# Patient Record
Sex: Female | Born: 1975 | Race: White | Hispanic: No | Marital: Married | Smoking: Never smoker
Health system: Southern US, Community
[De-identification: ages and names within clinical notes are randomized; demographics above are authoritative.]

## PROBLEM LIST (undated history)

## (undated) DIAGNOSIS — Z789 Other specified health status: Secondary | ICD-10-CM

## (undated) HISTORY — PX: TONSILLECTOMY: SUR1361

## (undated) HISTORY — PX: WISDOM TOOTH EXTRACTION: SHX21

## (undated) HISTORY — PX: BREAST BIOPSY: SHX20

---

## 2003-05-03 ENCOUNTER — Other Ambulatory Visit: Admission: RE | Admit: 2003-05-03 | Discharge: 2003-05-03 | Payer: Self-pay | Admitting: Obstetrics and Gynecology

## 2003-10-22 ENCOUNTER — Emergency Department (HOSPITAL_COMMUNITY): Admission: EM | Admit: 2003-10-22 | Discharge: 2003-10-22 | Payer: Self-pay | Admitting: Emergency Medicine

## 2004-05-11 ENCOUNTER — Other Ambulatory Visit: Admission: RE | Admit: 2004-05-11 | Discharge: 2004-05-11 | Payer: Self-pay | Admitting: Obstetrics and Gynecology

## 2005-04-09 ENCOUNTER — Ambulatory Visit: Payer: Self-pay

## 2005-04-09 ENCOUNTER — Ambulatory Visit: Payer: Self-pay | Admitting: Cardiology

## 2005-04-13 ENCOUNTER — Ambulatory Visit: Payer: Self-pay | Admitting: Internal Medicine

## 2005-04-19 ENCOUNTER — Ambulatory Visit (HOSPITAL_COMMUNITY): Admission: RE | Admit: 2005-04-19 | Discharge: 2005-04-19 | Payer: Self-pay | Admitting: Internal Medicine

## 2005-05-18 ENCOUNTER — Other Ambulatory Visit: Admission: RE | Admit: 2005-05-18 | Discharge: 2005-05-18 | Payer: Self-pay | Admitting: Obstetrics and Gynecology

## 2007-03-27 ENCOUNTER — Encounter: Admission: RE | Admit: 2007-03-27 | Discharge: 2007-03-27 | Payer: Self-pay | Admitting: Sports Medicine

## 2007-04-03 ENCOUNTER — Encounter: Admission: RE | Admit: 2007-04-03 | Discharge: 2007-04-03 | Payer: Self-pay | Admitting: Sports Medicine

## 2007-04-17 ENCOUNTER — Encounter: Admission: RE | Admit: 2007-04-17 | Discharge: 2007-04-17 | Payer: Self-pay | Admitting: Sports Medicine

## 2007-09-29 ENCOUNTER — Encounter: Admission: RE | Admit: 2007-09-29 | Discharge: 2007-09-29 | Payer: Self-pay | Admitting: Sports Medicine

## 2007-12-14 ENCOUNTER — Encounter: Admission: RE | Admit: 2007-12-14 | Discharge: 2007-12-14 | Payer: Self-pay | Admitting: Obstetrics and Gynecology

## 2009-04-21 LAB — HM PAP SMEAR

## 2009-08-13 ENCOUNTER — Ambulatory Visit: Payer: Self-pay | Admitting: Family Medicine

## 2009-08-14 ENCOUNTER — Telehealth: Payer: Self-pay | Admitting: *Deleted

## 2009-08-14 ENCOUNTER — Encounter: Admission: RE | Admit: 2009-08-14 | Discharge: 2009-08-14 | Payer: Self-pay | Admitting: Family Medicine

## 2009-08-14 ENCOUNTER — Ambulatory Visit: Payer: Self-pay | Admitting: Family Medicine

## 2009-08-15 ENCOUNTER — Telehealth: Payer: Self-pay | Admitting: Family Medicine

## 2009-08-15 ENCOUNTER — Encounter: Payer: Self-pay | Admitting: Family Medicine

## 2010-03-16 ENCOUNTER — Telehealth: Payer: Self-pay | Admitting: Family Medicine

## 2010-03-16 ENCOUNTER — Ambulatory Visit: Payer: Self-pay | Admitting: Family Medicine

## 2010-03-16 DIAGNOSIS — K645 Perianal venous thrombosis: Secondary | ICD-10-CM | POA: Insufficient documentation

## 2010-07-12 ENCOUNTER — Encounter: Payer: Self-pay | Admitting: Sports Medicine

## 2010-07-21 NOTE — Progress Notes (Signed)
Summary: Med  Phone Note From Pharmacy   Caller: [facility] Summary of Call: Do not have the Lidocaine % that you sent over. They do have the Lidocaine 3/0.5% available. Is this ok to use and if so will directions need to be changed Initial call taken by: Kathlene November,  March 16, 2010 1:35 PM  Follow-up for Phone Call        New one sent.  Follow-up by: Nani Gasser MD,  March 16, 2010 2:43 PM    New/Updated Medications: ANAMANTLE HC 3-0.5 % KIT (LIDOCAINE-HYDROCORTISONE ACE) Apply to rectal area Prescriptions: ANAMANTLE HC 3-0.5 % KIT (LIDOCAINE-HYDROCORTISONE ACE) Apply to rectal area  #1 kit x 0   Entered and Authorized by:   Nani Gasser MD   Signed by:   Nani Gasser MD on 03/16/2010   Method used:   Electronically to        [facility]* (retail)       [address] [place], [place]  [address]       Ph: [phone]       Fax: [phone]   RxID:   [id number]

## 2010-07-21 NOTE — Assessment & Plan Note (Signed)
Summary: rib pain   Vital Signs:  Patient profile:   35 year old female Height:      72 inches Weight:      148 pounds BMI:     20.14 O2 Sat:      99 % on Room air Temp:     98.3 degrees F oral Pulse rate:   65 / minute BP sitting:   116 / 71  (left arm) Cuff size:   regular  Vitals Entered By: Payton Spark CMA (August 14, 2009 4:21 PM)  O2 Flow:  Room air CC: L sided rib pain from coughing.    Primary Care Provider:  Nani Gasser, MD  CC:  L sided rib pain from coughing. Marland Kitchen  History of Present Illness: 35 yo WF presents for cough and L sided rib pain that started last night with deep cough.  She has severe pain with coughing and deep breathing.  She has pain with touching and moving.  No edema, brusing, redness or chest wall trauma.  Already on abx, inhaler and cough meds.  No fevers.  Not acutely SOB.  Current Medications (verified): 1)  Loestrin 1.5/30 (21) 1.5-30 Mg-Mcg Tabs (Norethindrone Acet-Ethinyl Est) .... Take One Tablet By Mouth Once A Day 2)  Wellbutrin Xl 300 Mg Xr24h-Tab (Bupropion Hcl) .... Take One Tablet By Mouth Once A Day 3)  Zoloft 100 Mg Tabs (Sertraline Hcl) .... Take One Tablet By Mouth Once A Day 4)  Multivitamins  Caps (Multiple Vitamin) .... Take One Capsule By Mouth Once A Day 5)  Vitamin C Cr 500 Mg Cr-Tabs (Ascorbic Acid) .... Take One Tablet By Mouth Once A Day 6)  Vitamin E 1000 Unit Caps (Vitamin E) .... Take One Capsule By Mouth Once Ad Ay 7)  Hydrocodone-Homatropine 5-1.5 Mg/86ml Syrp (Hydrocodone-Homatropine) .... 5ml By Mouth At Bedtime 8)  Doxycycline Hyclate 100 Mg Caps (Doxycycline Hyclate) .... Take 1 Tablet By Mouth Two Times A Day For 10 Days 9)  Proair Hfa 108 (90 Base) Mcg/act Aers (Albuterol Sulfate) .... 2- 4 Puffs Inhaled Every 4-6 Hours As Needed  Allergies (verified): No Known Drug Allergies  Past History:  Social History: Reviewed history from 08/13/2009 and no changes required. 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  Married to [employer]. Has 3 step-children.   Never Smoked Alcohol use-yes Drug use-no Regular exercise-yes 3 caffeinatedbeverages daily.   Review of Systems      See HPI  Physical Exam  General:  alert, well-developed, well-nourished, and well-hydrated.   Head:  normocephalic and atraumatic.   Nose:  no nasal discharge.   Mouth:  pharynx pink and moist.  hoarse Chest Wall:  L anterolateral lower ribs TTP with full chest wall/ UE ROM Lungs:  Normal respiratory effort, chest expands symmetrically. Lungs are clear to auscultation, no crackles or wheezes.  slight splinting with deep inspiration.  dry cough Heart:  Normal rate and regular rhythm. S1 and S2 normal without gallop, murmur, click, rub or other extra sounds. Abdomen:  soft and non-tender.   Skin:  no redness or bruising Cervical Nodes:  No lymphadenopathy noted Psych:  not anxious appearing.     Impression & Recommendations:  Problem # 1:  RIB PAIN, LEFT SIDED (ICD-786.50) Unlikely rib fracture but will Xray to be sure.  Likley inflammation around the rib muscles from deep coughing.  Can continue Motrin with food, heat and an ACE wrap for comfort along with deep breathing exercises to prevent atx. Continue current tx plan for  bronchitis. Orders: T-DG Chest 2 View ([id number])  Complete Medication List: 1)  Loestrin 1.5/30 (21) 1.5-30 Mg-mcg Tabs (Norethindrone acet-ethinyl est) .... Take one tablet by mouth once a day 2)  Wellbutrin Xl 300 Mg Xr24h-tab (Bupropion hcl) .... Take one tablet by mouth once a day 3)  Zoloft 100 Mg Tabs (Sertraline hcl) .... Take one tablet by mouth once a day 4)  Multivitamins Caps (Multiple vitamin) .... Take one capsule by mouth once a day 5)  Vitamin C Cr 500 Mg Cr-tabs (Ascorbic acid) .... Take one tablet by mouth once a day 6)  Vitamin E 1000 Unit Caps (Vitamin e) .... Take one capsule by mouth once ad ay 7)  Hydrocodone-homatropine 5-1.5 Mg/3ml Syrp (Hydrocodone-homatropine) .... 5ml by  mouth at bedtime 8)  Doxycycline Hyclate 100 Mg Caps (Doxycycline hyclate) .... Take 1 tablet by mouth two times a day for 10 days 9)  Proair Hfa 108 (90 Base) Mcg/act Aers (Albuterol sulfate) .... 2- 4 puffs inhaled every 4-6 hours as needed  Patient Instructions: 1)  CXR today. 2)  Will call you w/ results tomorrow. 3)  Use Ibuprofen 800 mg 3 x a day with food for pain. 4)  Use ACE wrap for comfort. 5)  Remember to do deep breathing exercises  - 10 deep breaths every hr during the day.

## 2010-07-21 NOTE — Progress Notes (Signed)
Summary: ? cracked rib  Phone Note Call from Patient Call back at [phone]   Caller: Patient Call For: Nani Gasser MD Summary of Call: Pt husband calls and states that ? cracked rib from coughing so much- says wife is like doubled over from coughing and in pain- please advise Initial call taken by: Kathlene November,  August 15, 2009 9:07 AM  Follow-up for Phone Call        Offered pain meds but pt declined at this point.  I personally viewed her CXR and am reassured that she has no rib fractures.  I will give her a work note thru [facility].   Follow-up by: Seymour Bars DO,  August 15, 2009 9:15 AM

## 2010-07-21 NOTE — Progress Notes (Signed)
Summary: Rx called in  Phone Note Call from Patient   Caller: Spouse Summary of Call: Dr.Metheney    Call Back  [phone]     Pharmacy--[facility] in [place]  Patient husband called and said that only one of the three meds was called in to the pharmacy yesterday---the one that was called in was the cough med.Marland KitchenMarland KitchenNeed the other two called in. Initial call taken by: Vanessa Swaziland,  August 14, 2009 10:00 AM  Follow-up for Phone Call        Rx Called In.  patient's husband is aware Follow-up by: Kern Reap CMA Duncan Dull),  August 14, 2009 10:13 AM

## 2010-07-21 NOTE — Assessment & Plan Note (Signed)
Summary: Hemorrhoids   Vital Signs:  Patient profile:   35 year old female Height:      72 inches Weight:      159 pounds Pulse rate:   61 / minute BP sitting:   125 / 78  (right arm) Cuff size:   regular  Vitals Entered By: Avon Gully CMA, Duncan Dull) (March 16, 2010 9:53 AM) CC: hemmrrhoids,first noticed friday   Primary Care Provider:  Nani Gasser, MD  CC:  hemmrrhoids and first noticed friday.  History of Present Illness: Hx of hemorrhoids. Thinks maybe has a blood clot in one of the hemorrhoids. Last week started running again and lifting heavy furniture. No constipation or change in bowels. Used preparation H over the weekend. No blood thinners. Uses metamucil daily. NO abdomianl pain. No bleeding.   Current Medications (verified): 1)  Loestrin 1.5/30 (21) 1.5-30 Mg-Mcg Tabs (Norethindrone Acet-Ethinyl Est) .... Take One Tablet By Mouth Once A Day 2)  Wellbutrin Xl 300 Mg Xr24h-Tab (Bupropion Hcl) .... Take One Tablet By Mouth Once A Day 3)  Multivitamins  Caps (Multiple Vitamin) .... Take One Capsule By Mouth Once A Day 4)  Vitamin C Cr 500 Mg Cr-Tabs (Ascorbic Acid) .... Take One Tablet By Mouth Once A Day 5)  Vitamin E 1000 Unit Caps (Vitamin E) .... Take One Capsule By Mouth Once Ad Ay 6)  Proair Hfa 108 (90 Base) Mcg/act Aers (Albuterol Sulfate) .... 2- 4 Puffs Inhaled Every 4-6 Hours As Needed  Allergies (verified): No Known Drug Allergies  Comments:  Nurse/Medical Assistant: The patient's medications and allergies were reviewed with the patient and were updated in the Medication and Allergy Lists. Avon Gully CMA, Duncan Dull) (March 16, 2010 9:54 AM)  Physical Exam  General:  Well-developed,well-nourished,in no acute distress; alert,appropriate and cooperative throughout examination Rectal:  Thrombosed hemorrhoid at the 3 oclock position. Really not very tender.  2 other hemorrhoids that are not imflammed   Impression &  Recommendations:  Problem # 1:  HEMORRHOID, THROMBOSED (ICD-455.7) Keep stools soft. Avoid lifting weigths.  Since really she is not in alot of pain will tx conservatively with topical cream and sitz baths.  Offered to lance today or later if not better. She decided to try the cream first and if not better or worse to f/u for lancing.   Complete Medication List: 1)  Loestrin 1.5/30 (21) 1.5-30 Mg-mcg Tabs (Norethindrone acet-ethinyl est) .... Take one tablet by mouth once a day 2)  Wellbutrin Xl 300 Mg Xr24h-tab (Bupropion hcl) .... Take one tablet by mouth once a day 3)  Multivitamins Caps (Multiple vitamin) .... Take one capsule by mouth once a day 4)  Vitamin C Cr 500 Mg Cr-tabs (Ascorbic acid) .... Take one tablet by mouth once a day 5)  Vitamin E 1000 Unit Caps (Vitamin e) .... Take one capsule by mouth once ad ay 6)  Proair Hfa 108 (90 Base) Mcg/act Aers (Albuterol sulfate) .... 2- 4 puffs inhaled every 4-6 hours as needed 7)  Lidocaine-hydrocortisone Ace 3-1 % Kit (Lidocaine-hydrocortisone ace) .... Apply to rectal area.  Other Orders: Flu Vaccine 46yrs + ([address]) Admin 1st Vaccine ([address])  Patient Instructions: 1)  Sitz baths 3 x day.  2)  Call if not better in 1-2 weeks or if getting worse.   Prescriptions: LIDOCAINE-HYDROCORTISONE ACE 3-1 % KIT (LIDOCAINE-HYDROCORTISONE ACE) Apply to rectal area.  #1 kit. x 1   Entered and Authorized by:   Nani Gasser MD   Signed by:   Santina Evans  Metheney MD on 03/16/2010   Method used:   Electronically to        [facility]* (retail)       [address]       [place], [place]  [address]       Ph: [phone]       Fax: [phone]   RxID:   [id number]    Immunization History:  Tetanus/Td Immunization History:    Tetanus/Td:  historical (06/21/2004)   Immunization History:  Tetanus/Td Immunization History:    Tetanus/Td:  historical (06/21/2004)  Immunizations Administered:  Influenza Vaccine  # 1:    Vaccine Type: Fluvax 3+    Site: right deltoid    Mfr: GlaxoSmithKline    Dose: 0.5 ml    Route: IM    Given by: Sue Lush McCrimmon CMA, (AAMA)    Exp. Date: 12/19/2010    Lot #: [id number]    VIS given: 01/13/10 version given March 16, 2010.  Flu Vaccine Consent Questions:    Do you have a history of severe allergic reactions to this vaccine? no    Any prior history of allergic reactions to egg and/or gelatin? no    Do you have a sensitivity to the preservative Thimersol? no    Do you have a past history of Guillan-Barre Syndrome? no    Do you currently have an acute febrile illness? no    Have you ever had a severe reaction to latex? no    Vaccine information given and explained to patient? no    Are you currently pregnant? no

## 2010-07-21 NOTE — Assessment & Plan Note (Signed)
Summary: NOV: Cough   Vital Signs:  Patient profile:   35 year old female Height:      72 inches Weight:      147 pounds BMI:     20.01 O2 Sat:      97 % on Room air Temp:     98.4 degrees F oral Pulse rate:   65 / minute BP sitting:   113 / 62  (left arm) Cuff size:   regular  Vitals Entered By: Kathlene November (August 13, 2009 1:10 PM)  O2 Flow:  Room air CC: NP- bad cough and hoarseness for over 1 week now- not sleeping due to cough Is Patient Diabetic? No   Primary Care Provider:  Nani Gasser, MD  CC:  NP- bad cough and hoarseness for over 1 week now- not sleeping due to cough.  History of Present Illness: NP- bad cough and hoarseness for over 1 week now- not sleeping due to cough.  Never hadthis before. Started with chest burning with cough. Now has CP.  Cough is dry.  No sinus sxs. No ST.  No pulmonary problems.No fever.  Tried mucinex DM, sudafed, cough meds - nothing has touched it.No SOB. Non-smoker. Husband with similar sxs but he is better.   Habits & Providers  Alcohol-Tobacco-Diet     Alcohol drinks/day: <1     Tobacco Status: never  Exercise-Depression-Behavior     Does Patient Exercise: yes     Type of exercise: cardio, resistence training     Exercise (avg: min/session): 30-60     Times/week: 4     STD Risk: never     Drug Use: no     Seat Belt Use: always  Current Medications (verified): 1)  Loestrin 1.5/30 (21) 1.5-30 Mg-Mcg Tabs (Norethindrone Acet-Ethinyl Est) .... Take One Tablet By Mouth Once A Day 2)  Wellbutrin Xl 300 Mg Xr24h-Tab (Bupropion Hcl) .... Take One Tablet By Mouth Once A Day 3)  Zoloft 100 Mg Tabs (Sertraline Hcl) .... Take One Tablet By Mouth Once A Day 4)  Multivitamins  Caps (Multiple Vitamin) .... Take One Capsule By Mouth Once A Day 5)  Vitamin C Cr 500 Mg Cr-Tabs (Ascorbic Acid) .... Take One Tablet By Mouth Once A Day 6)  Vitamin E 1000 Unit Caps (Vitamin E) .... Take One Capsule By Mouth Once Ad Ay  Allergies  (verified): No Known Drug Allergies  Comments:  Nurse/Medical Assistant: The patient's medications and allergies were reviewed with the patient and were updated in the Medication and Allergy Lists. 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 Married to [employer]. Has 3 step-children.   Never Smoked Alcohol use-yes Drug use-no Regular exercise-yes 3 caffeinatedbeverages daily. Smoking Status:  never Does Patient Exercise:  yes STD Risk:  never Drug Use:  no Seat Belt Use:  always  Review of Systems       No fever/sweats/weakness, unexplained weight loss/gain.  No vison changes.  No difficulty hearing/ringing in ears, hay fever/allergies.  No chest pain/discomfort, palpitations.  No Br lump/nipple discharge.  + cough/wheeze.  No blood in BM, nausea/vomiting/diarrhea.  No nighttime urination, leaking urine, unusual vaginal bleeding, discharge (penis or vagina).  No muscle/joint pain. No rash, change in mole.  No HA, memory loss.  No anxiety, sleep d/o, depression.  No easy bruising/bleeding, unexplained  lump   Physical Exam  General:  Well-developed,well-nourished,in no acute distress; alert,appropriate and cooperative throughout examination Head:  Normocephalic and atraumatic without obvious abnormalities. No apparent alopecia or balding. Eyes:  No corneal or conjunctival inflammation noted. EOMI. Perrla.  Ears:  External ear exam shows no significant lesions or deformities.  Otoscopic examination reveals clear canals, tympanic membranes are intact bilaterally without bulging, retraction, inflammation or discharge. Hearing is grossly normal bilaterally. Nose:  External nasal examination shows no deformity or inflammation. Nasal mucosa are pink and moist without lesions or exudates. Mouth:  Oral mucosa  and oropharynx without lesions or exudates.  Teeth in good repair. Neck:  No deformities, masses, or tenderness noted. Lungs:  Normal respiratory effort, chest expands symmetrically. Lungs are clear to auscultation, no crackles or wheezes. Heart:  Normal rate and regular rhythm. S1 and S2 normal without gallop, murmur, click, rub or other extra sounds. Skin:  no rashes.   Cervical Nodes:  no anterior cervical adenopathy.   Psych:  Cognition and judgment appear intact. Alert and cooperative with normal attention span and concentration. No apparent delusions, illusions, hallucinations   Impression & Recommendations:  Problem # 1:  BRONCHITIS, ACUTE (ICD-466.0)  Her updated medication list for this problem includes:    Hydrocodone-homatropine 5-1.5 Mg/63ml Syrp (Hydrocodone-homatropine) .Marland KitchenMarland KitchenMarland KitchenMarland Kitchen 5ml by mouth at bedtime    Doxycycline Hyclate 100 Mg Caps (Doxycycline hyclate) .Marland Kitchen... Take 1 tablet by mouth two times a day for 10 days    Proair Hfa 108 (90 Base) Mcg/act Aers (Albuterol sulfate) .Marland Kitchen... 2- 4 puffs inhaled every 4-6 hours as needed  Take antibiotics and other medications as directed. Encouraged to push clear liquids, get enough rest, and take acetaminophen as needed. To be seen in 5-7 days if no improvement, sooner if worse.  Complete Medication List: 1)  Loestrin 1.5/30 (21) 1.5-30 Mg-mcg Tabs (Norethindrone acet-ethinyl est) .... Take one tablet by mouth once a day 2)  Wellbutrin Xl 300 Mg Xr24h-tab (Bupropion hcl) .... Take one tablet by mouth once a day 3)  Zoloft 100 Mg Tabs (Sertraline hcl) .... Take one tablet by mouth once a day 4)  Multivitamins Caps (Multiple vitamin) .... Take one capsule by mouth once a day 5)  Vitamin C Cr 500 Mg Cr-tabs (Ascorbic acid) .... Take one tablet by mouth once a day 6)  Vitamin E 1000 Unit Caps (Vitamin e) .... Take one capsule by mouth once ad ay 7)  Hydrocodone-homatropine 5-1.5 Mg/37ml Syrp (Hydrocodone-homatropine) .... 5ml by mouth at  bedtime 8)  Doxycycline Hyclate 100 Mg Caps (Doxycycline hyclate) .... Take 1 tablet by mouth two times a day for 10 days 9)  Proair Hfa 108 (90 Base) Mcg/act Aers (Albuterol sulfate) .... 2- 4 puffs inhaled every 4-6 hours as needed Prescriptions: PROAIR HFA 108 (90 BASE) MCG/ACT AERS (ALBUTEROL SULFATE) 2- 4 puffs inhaled every 4-6 hours as needed  #1 x 0   Entered and Authorized by:   Nani Gasser MD   Signed by:   Nani Gasser MD on 08/13/2009   Method used:   Printed then faxed to ...       [facility]* (retail)       [address] [place], [place]  [address]       Ph: [phone]       Fax: [phone]   RxID:   [id number] DOXYCYCLINE HYCLATE 100 MG CAPS (DOXYCYCLINE HYCLATE) Take 1 tablet by mouth two times a day for 10  days  #20 x 0   Entered and Authorized by:   Nani Gasser MD   Signed by:   Nani Gasser MD on 08/13/2009   Method used:   Printed then faxed to ...       [facility]* (retail)       [address] [place], [place]  [address]       Ph: [phone]       Fax: [phone]   RxID:   [id number] HYDROCODONE-HOMATROPINE 5-1.5 MG/5ML SYRP (HYDROCODONE-HOMATROPINE) 5ml by mouth at bedtime  #143ml x 0   Entered and Authorized by:   Nani Gasser MD   Signed by:   Nani Gasser MD on 08/13/2009   Method used:   Printed then faxed to ...       [facility]* (retail)       [address] [place], [place]  [address]       Ph: [phone]       Fax: [phone]   RxID:   [id number]   PAP Result Date:  04/21/2009 PAP Result:  normal

## 2010-07-21 NOTE — Letter (Signed)
Summary: Out of Work  [facility]  [address]   [place], [place] [address]   Phone: [phone]  Fax: [phone]    August 15, 2009   Employee:  LETISIA SCHWALB    To Whom It May Concern:   For Medical reasons, please excuse the above named employee from work for the following dates:  Start:   Feb 25th - March 2nd  End:   March 3rd  If you need additional information, please feel free to contact our office.         Sincerely,    Seymour Bars DO

## 2010-08-04 ENCOUNTER — Encounter: Payer: Self-pay | Admitting: Family Medicine

## 2010-08-04 ENCOUNTER — Ambulatory Visit (INDEPENDENT_AMBULATORY_CARE_PROVIDER_SITE_OTHER): Payer: 59 | Admitting: Family Medicine

## 2010-08-04 DIAGNOSIS — F4321 Adjustment disorder with depressed mood: Secondary | ICD-10-CM

## 2010-08-04 DIAGNOSIS — F324 Major depressive disorder, single episode, in partial remission: Secondary | ICD-10-CM | POA: Insufficient documentation

## 2010-08-04 DIAGNOSIS — J069 Acute upper respiratory infection, unspecified: Secondary | ICD-10-CM | POA: Insufficient documentation

## 2010-08-12 NOTE — Assessment & Plan Note (Signed)
Summary: Melinda Reynolds, INsomnia, grieving   Vital Signs:  Patient profile:   35 year old female Height:      72 inches Weight:      166 pounds Pulse rate:   72 / minute BP sitting:   119 / 67  (right arm) Cuff size:   regular  Vitals Entered By: Avon Gully CMA, Duncan Dull) (August 04, 2010 1:54 PM) CC: wants something to help speed up cold sx, wants something to help her sleep   Primary Care Provider:  Nani Gasser, MD  CC:  wants something to help speed up cold sx and wants something to help her sleep.  History of Present Illness: wants something to help speed up cold sx, wants something to help her sleep.  She has been sick for about 3 days with nasal congestion, yellow. No fever. Mild cough but from driange. NO other chest sxs.. Left ear pressure.Taking mucinex D and Affrin, Benadryl at Advil.   Her father has been given 2-4 weeks to live and she is not allowed to visit him while he is sick. She is flying out in about 2 days and she will stay there until he passes away. She is currently on Wellbutrin and is no sleeping and is crying all tht time to the point of almost hyperventilating.   Current Medications (verified): 1)  Loestrin 1.5/30 (21) 1.5-30 Mg-Mcg Tabs (Norethindrone Acet-Ethinyl Est) .... Take One Tablet By Mouth Once A Day 2)  Wellbutrin Xl 300 Mg Xr24h-Tab (Bupropion Hcl) .... Take One Tablet By Mouth Once A Day 3)  Multivitamins  Caps (Multiple Vitamin) .... Take One Capsule By Mouth Once A Day 4)  Vitamin C Cr 500 Mg Cr-Tabs (Ascorbic Acid) .... Take One Tablet By Mouth Once A Day 5)  Vitamin E 1000 Unit Caps (Vitamin E) .... Take One Capsule By Mouth Once Ad Ay 6)  Proair Hfa 108 (90 Base) Mcg/act Aers (Albuterol Sulfate) .... 2- 4 Puffs Inhaled Every 4-6 Hours As Needed 7)  Anamantle Hc 3-0.5 % Kit (Lidocaine-Hydrocortisone Ace) .... Apply To Rectal Area  Allergies (verified): No Known Drug Allergies  Comments:  Nurse/Medical Assistant: The patient's  medications and allergies were reviewed with the patient and were updated in the Medication and Allergy Lists. Avon Gully CMA, Duncan Dull) (August 04, 2010 1:55 PM)  Family History: Father with MI, Hi chol, small cell lung ca.  GF wtih DM  Social History: Used to work in Administrator, arts. Married to [employer]. Has 3 step-children.   Never Smoked Alcohol use-yes Drug use-no Regular exercise-yes 3 caffeinatedbeverages daily.   Physical Exam  General:  Well-developed,well-nourished,in no acute distress; alert,appropriate and cooperative throughout examination Head:  Normocephalic and atraumatic without obvious abnormalities. No apparent alopecia or balding. Eyes:  No corneal or conjunctival inflammation noted. EOMI. Perrla.  Ears:  External ear exam shows no significant lesions or deformities.  Otoscopic examination reveals clear canals, tympanic membranes are intact bilaterally without bulging, retraction, inflammation or discharge. Hearing is grossly normal bilaterally. Nose:  External nasal examination shows no deformity or inflammation.  Mouth:  Oral mucosa and oropharynx without lesions or exudates.  Teeth in good repair. Neck:  No deformities, masses, or tenderness noted. Lungs:  Normal respiratory effort, chest expands symmetrically. Lungs are clear to auscultation, no crackles or wheezes. Heart:  Normal rate and regular rhythm. S1 and S2 normal without gallop, murmur, click, rub or other extra sounds.   Impression & Recommendations:  Problem # 1:  Melinda Reynolds (ICD-465.9)  Instructed  on symptomatic treatment. Call if symptoms persist or worsen. I worsening or not better in one week can fill the ABX. Since will be out of state for several weeks gave her to rx to take with her if not getting better. Use appropriate hygiene and usual precaution.   Problem # 2:  GRIEF REACTION, ACUTE (ICD-309.0) Will start sertraline which she has taken in the past nad done well  with. Will taper the  wellbutrin. In one month can go back to the sertaline 100mg  tabs (which she states she still has at home).  Also given xanax to help her fall asleep and to use if she feels panicky. Also discussed the short term nature of the xanax since it can be addictive and cause dependency. Explained 45 tabs should last until I see her back in 4-6 weeks (or when she gets backl). Pt to call or get help and starts to feel overwhelmed. Her family is very supportive.  She may benefit from grief counseling when she returns.   Problem # 3:  INSOMNIA (ICD-780.52) Directly related to her grieving. See note on this.   Complete Medication List: 1)  Loestrin 1.5/30 (21) 1.5-30 Mg-mcg Tabs (Norethindrone acet-ethinyl est) .... Take one tablet by mouth once a day 2)  Wellbutrin Xl 150 Mg Xr24h-tab (Bupropion hcl) .... Take 1 tablet by mouth once a day 3)  Multivitamins Caps (Multiple vitamin) .... Take one capsule by mouth once a day 4)  Vitamin C Cr 500 Mg Cr-tabs (Ascorbic acid) .... Take one tablet by mouth once a day 5)  Vitamin E 1000 Unit Caps (Vitamin e) .... Take one capsule by mouth once ad ay 6)  Proair Hfa 108 (90 Base) Mcg/act Aers (Albuterol sulfate) .... 2- 4 puffs inhaled every 4-6 hours as needed 7)  Anamantle Hc 3-0.5 % Kit (Lidocaine-hydrocortisone ace) .... Apply to rectal area 8)  Sertraline Hcl 50 Mg Tabs (Sertraline hcl) .... 1/2 tab by mouth for the first week then increase to whole tab daily 9)  Zithromax Z-pak 250 Mg Tabs (Azithromycin) .... Take as directed. 10)  Alprazolam 0.5 Mg Tabs (Alprazolam) .... Take 1 tablet by mouth two times a day as needed for anxiety/nerves   Patient Instructions: 1)  Start the sertaline (zoloft) per the bottle instructions.  2)  Then after 2 weeks start the new bottle of WEllbutrine 150mg XL and take for 2 weeks and then take every other day for 2 weeks and then stop.  3)  Then follow up with me when you get back.   4)  Xanax is the rescue medicine. Can use at  bedtime or take maybe  half a pill during the day.  Prescriptions: WELLBUTRIN XL 150 MG XR24H-TAB (BUPROPION HCL) Take 1 tablet by mouth once a day  #30 x 0   Entered and Authorized by:   Nani Gasser MD   Signed by:   Nani Gasser MD on 08/04/2010   Method used:   Electronically to        [facility]* (retail)       [address]       [place], [place]  [address]       Ph: [phone]       Fax: [phone]   RxID:   [id number] ALPRAZOLAM 0.5 MG TABS (ALPRAZOLAM) Take 1 tablet by mouth two times a day as needed for anxiety/nerves  #45 x 0   Entered and Authorized by:   Nani Gasser MD   Signed  by:   Nani Gasser MD on 08/04/2010   Method used:   Printed then faxed to ...       [facility]* (retail)       [address]       [place], [place]  [address]       Ph: [phone]       Fax: [phone]   RxID:   [id number] ZITHROMAX Z-PAK 250 MG TABS (AZITHROMYCIN) TAke as directed.  #1 pack x 0   Entered and Authorized by:   Nani Gasser MD   Signed by:   Nani Gasser MD on 08/04/2010   Method used:   Electronically to        [facility]* (retail)       [address]       [place], [place]  [address]       Ph: [phone]       Fax: [phone]   RxID:   [id number] SERTRALINE HCL 50 MG TABS (SERTRALINE HCL) 1/2 tab by mouth for the first week then increase to whole tab daily  #30 x 0   Entered and Authorized by:   Nani Gasser MD   Signed by:   Nani Gasser MD on 08/04/2010   Method used:   Electronically to        [facility]* (retail)       [address]       [place], [place]  [address]       Ph: [phone]       Fax: [phone]   RxID:   [id number]    Orders Added: 1)  Est. Patient Level IV [[address]]

## 2010-10-15 ENCOUNTER — Encounter: Payer: Self-pay | Admitting: Family Medicine

## 2010-10-15 ENCOUNTER — Ambulatory Visit (INDEPENDENT_AMBULATORY_CARE_PROVIDER_SITE_OTHER): Payer: 59 | Admitting: Family Medicine

## 2010-10-15 VITALS — BP 112/70 | HR 69 | Ht 72.0 in | Wt 154.0 lb

## 2010-10-15 DIAGNOSIS — F329 Major depressive disorder, single episode, unspecified: Secondary | ICD-10-CM

## 2010-10-15 DIAGNOSIS — F32A Depression, unspecified: Secondary | ICD-10-CM

## 2010-10-15 MED ORDER — BUPROPION HCL ER (XL) 300 MG PO TB24: 150.0000 mg | ORAL_TABLET | Freq: Every day | ORAL | Status: DC

## 2010-10-15 MED ORDER — SERTRALINE HCL 50 MG PO TABS: ORAL_TABLET | ORAL | Status: DC

## 2010-10-15 NOTE — Progress Notes (Signed)
  Subjective:    Patient ID: Melinda Reynolds, female    DOB: [birth year], 35 y.o.   MRN: [id number]  HPI Ran out of the zoloft and almost off the wellbutrin. At some point she wants to get pregnant.  Right now taking 300mg  of the wellbutrin and off the zoloft. Hasn't felt as well controlled off the zoloft.  Had tried lexapro and fet it didn't really work. She says her husband has  complained about her irritability. l  Review of Systems     Objective:   Physical Exam  Constitutional: She appears well-developed and well-nourished.  HENT:  Head: Normocephalic and atraumatic.  Cardiovascular: Normal rate, regular rhythm and normal heart sounds.   Pulmonary/Chest: Effort normal and breath sounds normal.          Assessment & Plan:

## 2010-10-15 NOTE — Assessment & Plan Note (Signed)
We discussed several different options. She did restart her Zoloft in combination with Wellbutrin which worked really well for her. If she does well then hopefully we can wean off the Wellbutrin so she can be on a single medication. I also discussed that we could consider changing her to something that might be considered a little bit safer in the first trimester pregnancy in case she gets pregnant. For all of the SSRIs have some  contraindication for pregnancy. She decided at this time she would like to just get back on the sertraline and the Wellbutrin combination and in followup in 2 months.

## 2010-11-17 ENCOUNTER — Other Ambulatory Visit: Payer: Self-pay | Admitting: Family Medicine

## 2010-11-20 ENCOUNTER — Telehealth: Payer: Self-pay | Admitting: *Deleted

## 2010-11-20 NOTE — Telephone Encounter (Signed)
Pt called and states she wants to see a GI doc for "breath" issues. Pt wants to see Dr. Karena Addison [facility] regional Physician [phone]

## 2011-03-15 LAB — RUBELLA ANTIBODY, IGM: Rubella: IMMUNE

## 2011-05-19 ENCOUNTER — Ambulatory Visit (HOSPITAL_COMMUNITY)
Admission: RE | Admit: 2011-05-19 | Discharge: 2011-05-19 | Disposition: A | Discharge status: Home / Self Care | Payer: 59 | Source: Ambulatory Visit | Attending: Obstetrics and Gynecology | Admitting: Obstetrics and Gynecology

## 2011-05-19 DIAGNOSIS — Z8489 Family history of other specified conditions: Secondary | ICD-10-CM | POA: Insufficient documentation

## 2011-05-19 NOTE — Progress Notes (Signed)
Genetic Counseling  High-Risk Gestation Note  Appointment Date:  05/19/2011 Referred By: Loney Laurence, MD Date of Birth:  December 09, 1975 Partner:  Fae Pippin    Pregnancy History: G1P0 Estimated Date of Delivery: 09/30/11 Estimated Gestational Age: 34.9 weeks  I met with [name] for genetic counseling regarding family history concerns.  Prior to our appointment, Melinda Reynolds reported that her husband's father has hemophilia and a cleft lip.  As her father-in-law has been out of the country and unavailable, she was not able to obtain specific information regarding his diagnosis/health concerns until this morning.  No medical records were available for review.    During our appointment, Melinda Reynolds reported that her father-in-law does not have a diagnosis of hemophilia.  He has a large abdominal nevus flammeus, or port wine stain.  In addition, he had surgery during early childhood to remove an abdominal mass (no information regarding the pathology of this mass).  He also had a unilateral cleft lip.  Melinda Reynolds father-in-law is currently in his 33's and has no other known health or medical concerns.  He has normal intellect. There is no known asymmetry between his left and right sides.  He has no other nevi.  He has never been evaluated by a medical geneticist.  Melinda Reynolds was counseled that these findings could be sporadic, multifactorial, environmental, or genetic in etiology.  Although the majority of vascular malformations occur sporadically and are not related to a specific underlying genetic cause, we discussed two genetic syndromes in which port wine stains can be present: Sturge-Weber syndrome (SWS) and Klippel-Trenaunay syndrome (KTS).  We discussed that both of these syndromes are rare and typically are associated with additional medical and/or health problems.  Individuals with SWS often have facial port wine stains, which may affect the brain, leading to seizures, developmental  delay, and paralysis; and individuals with KTS usually have port wine stains involving the lower extremities, in addition to venous and lymphatic malformations, and soft-tissue hypertrophy of the affected area. Considering that Melinda Reynolds's father-in-law does not have any other medical concerns, it is unlikely that he has either of these diagnoses.  Regarding the cleft lip, we discussed that it is difficult to determine whether the clefting occurred as a separate isolated event, or was somehow related to the other previously described vascular insult. I consulted with a pediatric medical geneticist, and neither of [place] recognize a specific genetic syndrome which involves this specific combination of orofacial clefting and vascular findings.  Melinda Reynolds was counseled that it is difficult to provide an accurate risk assessment without her father-in-law being evaluated by a medical geneticist.  She was counseled that if the findings were sporadic and not part of an underlying genetic syndrome, the risk of recurrence is estimated to be low.  If however, the reported findings are part of an underlying genetic syndrome, the risk of recurrence depends on the specific syndrome and the inheritance.    We discussed that a detailed anatomy scan (performed last week at the referring provider's office) can typically detect cleft lip, in a well visualized fetus.  However, vascular malformations are not always apparent in utero, and may not even be visible until days after delivery.  Without further information regarding the provided family history, an accurate genetic risk cannot be calculated.  Further genetic counseling is warranted if more information is obtained.  The patient denied exposure to environmental toxins or chemical agents.  She denied the use of alcohol, tobacco or street  drugs.  She denied significant viral illnesses during the course of her pregnancy.    I counseled the patient for approximately 44  minutes regarding the above risks.     Donald Prose, MS Certified Genetic Counselor

## 2011-06-01 ENCOUNTER — Other Ambulatory Visit: Payer: Self-pay

## 2011-09-06 ENCOUNTER — Encounter (HOSPITAL_COMMUNITY): Payer: Self-pay | Admitting: *Deleted

## 2011-09-06 ENCOUNTER — Inpatient Hospital Stay (HOSPITAL_COMMUNITY)
Admission: AD | Admit: 2011-09-06 | Discharge: 2011-09-06 | Disposition: A | Discharge status: Home / Self Care | Payer: 59 | Source: Ambulatory Visit | Attending: Obstetrics & Gynecology | Admitting: Obstetrics & Gynecology

## 2011-09-06 DIAGNOSIS — O4700 False labor before 37 completed weeks of gestation, unspecified trimester: Secondary | ICD-10-CM | POA: Insufficient documentation

## 2011-09-06 DIAGNOSIS — O30009 Twin pregnancy, unspecified number of placenta and unspecified number of amniotic sacs, unspecified trimester: Secondary | ICD-10-CM | POA: Insufficient documentation

## 2011-09-06 HISTORY — DX: Other specified health status: Z78.9

## 2011-09-06 NOTE — Discharge Instructions (Signed)
Follow up with your regular scheduled appointment.  Call your doctor with any concerns or questions.

## 2011-09-06 NOTE — Progress Notes (Signed)
Dr. Arlyce Dice notified of VE and ctx.  No cervical change per MD.  Discharge pt home.  Follow up with regular scheduled appointment.

## 2011-09-06 NOTE — MAU Note (Signed)
Pt C/O uc's since 1330, more intense than Braxton Hick's.  Pt reports uc's are irregular.  Denies bleeding or LOF.

## 2011-09-07 NOTE — Progress Notes (Signed)
I examined Melinda Reynolds in MAU on 3/18.  She was having mild contractions but no significant cervical change.  Both twins had reactive FHTs.

## 2011-09-13 ENCOUNTER — Encounter (HOSPITAL_COMMUNITY): Payer: Self-pay

## 2011-09-14 ENCOUNTER — Encounter (HOSPITAL_COMMUNITY)
Admission: RE | Admit: 2011-09-14 | Discharge: 2011-09-14 | Disposition: A | Discharge status: Home / Self Care | Payer: 59 | Source: Ambulatory Visit | Attending: Obstetrics and Gynecology | Admitting: Obstetrics and Gynecology

## 2011-09-14 ENCOUNTER — Encounter (HOSPITAL_COMMUNITY): Payer: Self-pay

## 2011-09-14 LAB — RPR: RPR Ser Ql: NONREACTIVE

## 2011-09-14 LAB — SURGICAL PCR SCREEN: Staphylococcus aureus: POSITIVE — AB

## 2011-09-14 LAB — CBC
HCT: 39.7 % (ref 36.0–46.0)
Hemoglobin: 13.9 g/dL (ref 12.0–15.0)
MCHC: 35 g/dL (ref 30.0–36.0)
RBC: 4.41 MIL/uL (ref 3.87–5.11)

## 2011-09-14 NOTE — Patient Instructions (Addendum)
YOUR PROCEDURE IS SCHEDULED ON:09/16/11  ENTER THROUGH THE MAIN ENTRANCE OF [facility] AT:8am  USE DESK PHONE AND DIAL [phone] TO INFORM us OF YOUR ARRIVAL  CALL [phone] IF YOU HAVE ANY QUESTIONS OR PROBLEMS PRIOR TO YOUR ARRIVAL.  REMEMBER: DO NOT EAT OR DRINK AFTER MIDNIGHT : Wed  SPECIAL INSTRUCTIONS:   YOU MAY BRUSH YOUR TEETH THE MORNING OF SURGERY   TAKE THESE MEDICINES THE DAY OF SURGERY WITH SIP OF WATER: none   DO NOT WEAR JEWELRY, EYE MAKEUP, LIPSTICK OR DARK FINGERNAIL POLISH DO NOT WEAR LOTIONS  DO NOT SHAVE FOR 48 HOURS PRIOR TO SURGERY  YOU WILL NOT BE ALLOWED TO DRIVE YOURSELF HOME.  NAME OF DRIVER: Gregary Signs

## 2011-09-16 ENCOUNTER — Inpatient Hospital Stay (HOSPITAL_COMMUNITY): Payer: 59 | Admitting: Anesthesiology

## 2011-09-16 ENCOUNTER — Encounter (HOSPITAL_COMMUNITY): Payer: Self-pay | Admitting: *Deleted

## 2011-09-16 ENCOUNTER — Inpatient Hospital Stay (HOSPITAL_COMMUNITY)
Admission: RE | Admit: 2011-09-16 | Discharge: 2011-09-19 | DRG: 766 | Disposition: A | Discharge status: Home / Self Care | Payer: 59 | Source: Ambulatory Visit | Attending: Obstetrics and Gynecology | Admitting: Obstetrics and Gynecology

## 2011-09-16 ENCOUNTER — Encounter (HOSPITAL_COMMUNITY)
Admission: RE | Disposition: A | Discharge status: Home / Self Care | Payer: Self-pay | Source: Ambulatory Visit | Attending: Obstetrics and Gynecology

## 2011-09-16 ENCOUNTER — Encounter (HOSPITAL_COMMUNITY): Payer: Self-pay | Admitting: Anesthesiology

## 2011-09-16 DIAGNOSIS — O30009 Twin pregnancy, unspecified number of placenta and unspecified number of amniotic sacs, unspecified trimester: Secondary | ICD-10-CM | POA: Diagnosis present

## 2011-09-16 DIAGNOSIS — O3090 Multiple gestation, unspecified, unspecified trimester: Principal | ICD-10-CM | POA: Diagnosis present

## 2011-09-16 DIAGNOSIS — O329XX0 Maternal care for malpresentation of fetus, unspecified, not applicable or unspecified: Principal | ICD-10-CM | POA: Diagnosis present

## 2011-09-16 SURGERY — Surgical Case
Anesthesia: Spinal | Wound class: Clean Contaminated

## 2011-09-16 MED ORDER — MEPERIDINE HCL 25 MG/ML IJ SOLN: 6.2500 mg | INTRAMUSCULAR | Status: DC | PRN

## 2011-09-16 MED ORDER — KETOROLAC TROMETHAMINE 60 MG/2ML IM SOLN
60.0000 mg | Freq: Once | INTRAMUSCULAR | Status: AC | PRN
  Administered 2011-09-16: 60 mg via INTRAMUSCULAR

## 2011-09-16 MED ORDER — HYDROMORPHONE HCL PF 1 MG/ML IJ SOLN: 0.2500 mg | INTRAMUSCULAR | Status: DC | PRN

## 2011-09-16 MED ORDER — ONDANSETRON HCL 4 MG/2ML IJ SOLN
INTRAMUSCULAR | Status: DC | PRN
  Administered 2011-09-16: 4 mg via INTRAVENOUS

## 2011-09-16 MED ORDER — SODIUM CHLORIDE 0.9 % IV SOLN
1.0000 ug/kg/h | INTRAVENOUS | Status: DC | PRN
  Filled 2011-09-16: qty 2.5

## 2011-09-16 MED ORDER — OXYTOCIN 10 UNIT/ML IJ SOLN
INTRAMUSCULAR | Status: AC
  Filled 2011-09-16: qty 2

## 2011-09-16 MED ORDER — PHENYLEPHRINE 40 MCG/ML (10ML) SYRINGE FOR IV PUSH (FOR BLOOD PRESSURE SUPPORT)
PREFILLED_SYRINGE | INTRAVENOUS | Status: AC
  Filled 2011-09-16: qty 5

## 2011-09-16 MED ORDER — MORPHINE SULFATE (PF) 0.5 MG/ML IJ SOLN
INTRAMUSCULAR | Status: DC | PRN
  Administered 2011-09-16: .15 mg via INTRATHECAL

## 2011-09-16 MED ORDER — 0.9 % SODIUM CHLORIDE (POUR BTL) OPTIME
TOPICAL | Status: DC | PRN
  Administered 2011-09-16: 1000 mL

## 2011-09-16 MED ORDER — DIPHENHYDRAMINE HCL 25 MG PO CAPS
25.0000 mg | ORAL_CAPSULE | ORAL | Status: DC | PRN
  Filled 2011-09-16: qty 1

## 2011-09-16 MED ORDER — OXYTOCIN 10 UNIT/ML IJ SOLN
INTRAMUSCULAR | Status: AC
  Filled 2011-09-16: qty 1

## 2011-09-16 MED ORDER — KETOROLAC TROMETHAMINE 60 MG/2ML IM SOLN
INTRAMUSCULAR | Status: AC
  Administered 2011-09-16: 60 mg via INTRAMUSCULAR
  Filled 2011-09-16: qty 2

## 2011-09-16 MED ORDER — FERROUS SULFATE 325 (65 FE) MG PO TABS
325.0000 mg | ORAL_TABLET | Freq: Two times a day (BID) | ORAL | Status: DC
  Administered 2011-09-17 – 2011-09-19 (×5): 325 mg via ORAL
  Filled 2011-09-16 (×5): qty 1

## 2011-09-16 MED ORDER — ONDANSETRON HCL 4 MG/2ML IJ SOLN
INTRAMUSCULAR | Status: AC
  Filled 2011-09-16: qty 2

## 2011-09-16 MED ORDER — WITCH HAZEL-GLYCERIN EX PADS: 1.0000 "application " | MEDICATED_PAD | CUTANEOUS | Status: DC | PRN

## 2011-09-16 MED ORDER — KETOROLAC TROMETHAMINE 30 MG/ML IJ SOLN
30.0000 mg | Freq: Four times a day (QID) | INTRAMUSCULAR | Status: AC | PRN
  Filled 2011-09-16: qty 1

## 2011-09-16 MED ORDER — IBUPROFEN 600 MG PO TABS
600.0000 mg | ORAL_TABLET | Freq: Four times a day (QID) | ORAL | Status: DC
  Administered 2011-09-17 – 2011-09-19 (×11): 600 mg via ORAL
  Filled 2011-09-16 (×11): qty 1

## 2011-09-16 MED ORDER — MEASLES, MUMPS & RUBELLA VAC ~~LOC~~ INJ
0.5000 mL | INJECTION | Freq: Once | SUBCUTANEOUS | Status: DC
  Filled 2011-09-16: qty 0.5

## 2011-09-16 MED ORDER — PHENYLEPHRINE HCL 10 MG/ML IJ SOLN
INTRAMUSCULAR | Status: DC | PRN
  Administered 2011-09-16: 80 ug via INTRAVENOUS

## 2011-09-16 MED ORDER — SCOPOLAMINE 1 MG/3DAYS TD PT72
1.0000 | MEDICATED_PATCH | TRANSDERMAL | Status: DC
  Administered 2011-09-16: 1.5 mg via TRANSDERMAL

## 2011-09-16 MED ORDER — SCOPOLAMINE 1 MG/3DAYS TD PT72
1.0000 | MEDICATED_PATCH | Freq: Once | TRANSDERMAL | Status: DC
  Filled 2011-09-16: qty 1

## 2011-09-16 MED ORDER — IBUPROFEN 600 MG PO TABS: 600.0000 mg | ORAL_TABLET | Freq: Four times a day (QID) | ORAL | Status: DC | PRN

## 2011-09-16 MED ORDER — NALOXONE HCL 0.4 MG/ML IJ SOLN: 0.4000 mg | INTRAMUSCULAR | Status: DC | PRN

## 2011-09-16 MED ORDER — OXYTOCIN 20 UNITS IN LACTATED RINGERS INFUSION - SIMPLE
125.0000 mL/h | INTRAVENOUS | Status: AC
  Administered 2011-09-16: 125 mL/h via INTRAVENOUS

## 2011-09-16 MED ORDER — SODIUM CHLORIDE 0.9 % IJ SOLN: 3.0000 mL | INTRAMUSCULAR | Status: DC | PRN

## 2011-09-16 MED ORDER — SCOPOLAMINE 1 MG/3DAYS TD PT72
MEDICATED_PATCH | TRANSDERMAL | Status: AC
  Filled 2011-09-16: qty 1

## 2011-09-16 MED ORDER — OXYTOCIN 10 UNIT/ML IJ SOLN
INTRAMUSCULAR | Status: DC | PRN
  Administered 2011-09-16: 20 [IU]

## 2011-09-16 MED ORDER — SIMETHICONE 80 MG PO CHEW
80.0000 mg | CHEWABLE_TABLET | Freq: Three times a day (TID) | ORAL | Status: DC
  Administered 2011-09-16 – 2011-09-19 (×10): 80 mg via ORAL

## 2011-09-16 MED ORDER — DEXTROSE 5 % IV SOLN
2.0000 g | INTRAVENOUS | Status: AC
  Administered 2011-09-16: 2 g via INTRAVENOUS
  Filled 2011-09-16: qty 2

## 2011-09-16 MED ORDER — DIPHENHYDRAMINE HCL 25 MG PO CAPS
25.0000 mg | ORAL_CAPSULE | Freq: Four times a day (QID) | ORAL | Status: DC | PRN
  Administered 2011-09-17: 25 mg via ORAL

## 2011-09-16 MED ORDER — FENTANYL CITRATE 0.05 MG/ML IJ SOLN
INTRAMUSCULAR | Status: DC | PRN
  Administered 2011-09-16: 25 ug via INTRATHECAL

## 2011-09-16 MED ORDER — DIPHENHYDRAMINE HCL 50 MG/ML IJ SOLN
12.5000 mg | INTRAMUSCULAR | Status: DC | PRN
  Administered 2011-09-16: 12.5 mg via INTRAVENOUS

## 2011-09-16 MED ORDER — LACTATED RINGERS IV SOLN
INTRAVENOUS | Status: DC
  Administered 2011-09-16 (×2): via INTRAVENOUS
  Administered 2011-09-16 (×2): 125 mL/h via INTRAVENOUS
  Administered 2011-09-16: 10:00:00 via INTRAVENOUS

## 2011-09-16 MED ORDER — SENNOSIDES-DOCUSATE SODIUM 8.6-50 MG PO TABS
2.0000 | ORAL_TABLET | Freq: Every day | ORAL | Status: DC
  Administered 2011-09-16 – 2011-09-18 (×3): 2 via ORAL

## 2011-09-16 MED ORDER — ZOLPIDEM TARTRATE 5 MG PO TABS: 5.0000 mg | ORAL_TABLET | Freq: Every evening | ORAL | Status: DC | PRN

## 2011-09-16 MED ORDER — ONDANSETRON HCL 4 MG/2ML IJ SOLN: 4.0000 mg | Freq: Three times a day (TID) | INTRAMUSCULAR | Status: DC | PRN

## 2011-09-16 MED ORDER — FLEET ENEMA 7-19 GM/118ML RE ENEM: 1.0000 | ENEMA | Freq: Every day | RECTAL | Status: DC | PRN

## 2011-09-16 MED ORDER — DIPHENHYDRAMINE HCL 50 MG/ML IJ SOLN
25.0000 mg | INTRAMUSCULAR | Status: DC | PRN
  Filled 2011-09-16: qty 1

## 2011-09-16 MED ORDER — BISACODYL 10 MG RE SUPP: 10.0000 mg | Freq: Every day | RECTAL | Status: DC | PRN

## 2011-09-16 MED ORDER — LACTATED RINGERS IV SOLN
INTRAVENOUS | Status: DC
  Administered 2011-09-16: 21:00:00 via INTRAVENOUS

## 2011-09-16 MED ORDER — ONDANSETRON HCL 4 MG/2ML IJ SOLN: 4.0000 mg | INTRAMUSCULAR | Status: DC | PRN

## 2011-09-16 MED ORDER — NALBUPHINE SYRINGE 5 MG/0.5 ML
INJECTION | INTRAMUSCULAR | Status: AC
  Administered 2011-09-16: 10 mg via SUBCUTANEOUS
  Filled 2011-09-16: qty 1

## 2011-09-16 MED ORDER — FENTANYL CITRATE 0.05 MG/ML IJ SOLN
INTRAMUSCULAR | Status: AC
  Filled 2011-09-16: qty 2

## 2011-09-16 MED ORDER — MENTHOL 3 MG MT LOZG: 1.0000 | LOZENGE | OROMUCOSAL | Status: DC | PRN

## 2011-09-16 MED ORDER — TETANUS-DIPHTH-ACELL PERTUSSIS 5-2.5-18.5 LF-MCG/0.5 IM SUSP
0.5000 mL | Freq: Once | INTRAMUSCULAR | Status: AC
  Administered 2011-09-17: 0.5 mL via INTRAMUSCULAR
  Filled 2011-09-16: qty 0.5

## 2011-09-16 MED ORDER — DIBUCAINE 1 % RE OINT: 1.0000 "application " | TOPICAL_OINTMENT | RECTAL | Status: DC | PRN

## 2011-09-16 MED ORDER — LANOLIN HYDROUS EX OINT: 1.0000 "application " | TOPICAL_OINTMENT | CUTANEOUS | Status: DC | PRN

## 2011-09-16 MED ORDER — NALBUPHINE HCL 10 MG/ML IJ SOLN
5.0000 mg | INTRAMUSCULAR | Status: DC | PRN
  Administered 2011-09-17: 10 mg via SUBCUTANEOUS
  Filled 2011-09-16: qty 1

## 2011-09-16 MED ORDER — EPHEDRINE SULFATE 50 MG/ML IJ SOLN
INTRAMUSCULAR | Status: AC
  Administered 2011-09-16: 15 mg
  Filled 2011-09-16: qty 1

## 2011-09-16 MED ORDER — NALBUPHINE HCL 10 MG/ML IJ SOLN
5.0000 mg | INTRAMUSCULAR | Status: DC | PRN
  Administered 2011-09-16 – 2011-09-17 (×3): 5 mg via INTRAVENOUS
  Filled 2011-09-16 (×4): qty 1

## 2011-09-16 MED ORDER — ONDANSETRON HCL 4 MG PO TABS: 4.0000 mg | ORAL_TABLET | ORAL | Status: DC | PRN

## 2011-09-16 MED ORDER — BUTORPHANOL TARTRATE 2 MG/ML IJ SOLN
1.0000 mg | Freq: Once | INTRAMUSCULAR | Status: AC
  Administered 2011-09-16: 2 mg via INTRAVENOUS
  Filled 2011-09-16: qty 1

## 2011-09-16 MED ORDER — SIMETHICONE 80 MG PO CHEW: 80.0000 mg | CHEWABLE_TABLET | ORAL | Status: DC | PRN

## 2011-09-16 MED ORDER — KETOROLAC TROMETHAMINE 30 MG/ML IJ SOLN
30.0000 mg | Freq: Four times a day (QID) | INTRAMUSCULAR | Status: AC | PRN
  Administered 2011-09-16: 30 mg via INTRAVENOUS
  Filled 2011-09-16: qty 1

## 2011-09-16 MED ORDER — METHYLERGONOVINE MALEATE 0.2 MG/ML IJ SOLN: 0.2000 mg | INTRAMUSCULAR | Status: DC | PRN

## 2011-09-16 MED ORDER — OXYCODONE-ACETAMINOPHEN 5-325 MG PO TABS
1.0000 | ORAL_TABLET | ORAL | Status: DC | PRN
  Administered 2011-09-17 (×2): 2 via ORAL
  Filled 2011-09-16 (×2): qty 2

## 2011-09-16 MED ORDER — METHYLERGONOVINE MALEATE 0.2 MG PO TABS: 0.2000 mg | ORAL_TABLET | ORAL | Status: DC | PRN

## 2011-09-16 MED ORDER — OXYTOCIN 20 UNITS IN LACTATED RINGERS INFUSION - SIMPLE
INTRAVENOUS | Status: AC
  Administered 2011-09-16: 125 mL/h via INTRAVENOUS
  Filled 2011-09-16: qty 1000

## 2011-09-16 MED ORDER — METOCLOPRAMIDE HCL 5 MG/ML IJ SOLN: 10.0000 mg | Freq: Three times a day (TID) | INTRAMUSCULAR | Status: DC | PRN

## 2011-09-16 MED ORDER — MORPHINE SULFATE 0.5 MG/ML IJ SOLN
INTRAMUSCULAR | Status: AC
  Filled 2011-09-16: qty 10

## 2011-09-16 MED ORDER — PRENATAL MULTIVITAMIN CH
1.0000 | ORAL_TABLET | Freq: Every day | ORAL | Status: DC
  Administered 2011-09-17 – 2011-09-19 (×3): 1 via ORAL
  Filled 2011-09-16 (×3): qty 1

## 2011-09-16 SURGICAL SUPPLY — 41 items
CANNULA SEAL DVNC (CANNULA) IMPLANT
CANNULA SEALS DA VINCI (CANNULA)
CHLORAPREP W/TINT 26ML (MISCELLANEOUS) ×2 IMPLANT
CLOTH BEACON ORANGE TIMEOUT ST (SAFETY) ×2 IMPLANT
DRSG COVADERM 4X10 (GAUZE/BANDAGES/DRESSINGS) ×1 IMPLANT
ELECT REM PT RETURN 9FT ADLT (ELECTROSURGICAL) ×2
ELECTRODE REM PT RTRN 9FT ADLT (ELECTROSURGICAL) ×1 IMPLANT
EXTRACTOR VACUUM BELL STYLE (SUCTIONS) IMPLANT
GAUZE SPONGE 4X4 12PLY STRL LF (GAUZE/BANDAGES/DRESSINGS) ×1 IMPLANT
GLOVE BIO SURGEON STRL SZ7 (GLOVE) ×4 IMPLANT
GLOVE BIOGEL PI IND STRL 6.5 (GLOVE) IMPLANT
GLOVE BIOGEL PI IND STRL 7.0 (GLOVE) IMPLANT
GLOVE BIOGEL PI INDICATOR 6.5 (GLOVE) ×1
GLOVE BIOGEL PI INDICATOR 7.0 (GLOVE) ×4
GLOVE ECLIPSE 6.0 STRL STRAW (GLOVE) ×1 IMPLANT
GLOVE INDICATOR 6.5 STRL GRN (GLOVE) ×1 IMPLANT
GLOVE SURG SS PI 6.5 STRL IVOR (GLOVE) ×2 IMPLANT
GOWN PREVENTION PLUS LG XLONG (DISPOSABLE) ×7 IMPLANT
KIT ABG SYR 3ML LUER SLIP (SYRINGE) IMPLANT
NDL HYPO 25X5/8 SAFETYGLIDE (NEEDLE) IMPLANT
NEEDLE HYPO 25X5/8 SAFETYGLIDE (NEEDLE) IMPLANT
NS IRRIG 1000ML POUR BTL (IV SOLUTION) ×2 IMPLANT
PACK C SECTION WH (CUSTOM PROCEDURE TRAY) ×2 IMPLANT
PAD ABD 7.5X8 STRL (GAUZE/BANDAGES/DRESSINGS) ×1 IMPLANT
RETRACTOR WND ALEXIS 25 LRG (MISCELLANEOUS) ×1 IMPLANT
RTRCTR WOUND ALEXIS 25CM LRG (MISCELLANEOUS) ×2
SLEEVE SCD COMPRESS KNEE MED (MISCELLANEOUS) IMPLANT
STAPLER VISISTAT 35W (STAPLE) ×1 IMPLANT
SUT MNCRL 0 VIOLET CTX 36 (SUTURE) ×2 IMPLANT
SUT MONOCRYL 0 CTX 36 (SUTURE) ×2
SUT PDS AB 0 CTX 60 (SUTURE) IMPLANT
SUT PLAIN 2 0 (SUTURE) ×2
SUT PLAIN 2 0 XLH (SUTURE) IMPLANT
SUT PLAIN ABS 2-0 CT1 27XMFL (SUTURE) IMPLANT
SUT VIC AB 0 CT1 27 (SUTURE) ×4
SUT VIC AB 0 CT1 27XBRD ANBCTR (SUTURE) ×2 IMPLANT
SUT VIC AB 2-0 CT1 27 (SUTURE) ×2
SUT VIC AB 2-0 CT1 TAPERPNT 27 (SUTURE) ×1 IMPLANT
TOWEL OR 17X24 6PK STRL BLUE (TOWEL DISPOSABLE) ×4 IMPLANT
TRAY FOLEY CATH 14FR (SET/KITS/TRAYS/PACK) ×2 IMPLANT
WATER STERILE IRR 1000ML POUR (IV SOLUTION) ×2 IMPLANT

## 2011-09-16 NOTE — Anesthesia Procedure Notes (Signed)
Spinal  Patient location during procedure: OR Preanesthetic Checklist Completed: patient identified, site marked, surgical consent, pre-op evaluation, timeout performed, IV checked, risks and benefits discussed and monitors and equipment checked Spinal Block Patient position: sitting Prep: DuraPrep Patient monitoring: heart rate, cardiac monitor, continuous pulse ox and blood pressure Approach: midline Location: L3-4 Injection technique: single-shot Needle Needle type: Sprotte  Needle gauge: 24 G Needle length: 9 cm Assessment Sensory level: T4 Additional Notes Spinal Dosage in OR  Bupivicaine ml       2.0 PFMS04   mcg        150 Fentanyl mcg            25    

## 2011-09-16 NOTE — Anesthesia Postprocedure Evaluation (Signed)
  Anesthesia Post-op Note  Patient: Melinda Reynolds  Procedure(s) Performed: Procedure(s) (LRB): CESAREAN SECTION (N/A)   Patient is awake, responsive, moving her legs, and has signs of resolution of her numbness. Pain and nausea are reasonably well controlled. Vital signs are stable and clinically acceptable. Oxygen saturation is clinically acceptable. There are no apparent anesthetic complications at this time. Patient is ready for discharge.

## 2011-09-16 NOTE — Anesthesia Preprocedure Evaluation (Signed)

## 2011-09-16 NOTE — Progress Notes (Signed)
Dr. Sherron Ales at pt bedside.  Patient is hypotensive at this time, pale in color and has nausea.

## 2011-09-16 NOTE — Op Note (Signed)
09/16/2011  10:40 AM  PATIENT:  Melinda Reynolds  36 y.o. female  PRE-OPERATIVE DIAGNOSIS:  TWINS, baby A is breech  POST-OPERATIVE DIAGNOSIS:  TWINS, baby A is breech  PROCEDURE:  Procedure(s) (LRB): CESAREAN SECTION (N/A)  SURGEON:  Surgeon(s) and Role:    * Loney Laurence, MD - Primary   ASSISTANTS: none   ANESTHESIA:   spinal  EBL:  Total I/O In: 3700 [I.V.:3700] Out: 1300 [Urine:300; Blood:1000]  SPECIMEN:  Source of Specimen:  placenta  DISPOSITION OF SPECIMEN:  PATHOLOGY  COUNTS:  YES  PLAN OF CARE: Admit to inpatient   PATIENT DISPOSITION:  PACU - hemodynamically stable.   Delay start of Pharmacological VTE agent (>24hrs) due to surgical blood loss or risk of bleeding: not applicable  Complications:  None.  Medications:  Ancef, Pitocin Findings:  Baby A, breech, Apgars 8,10, weight P.   Baby B vtx, Apgars 8,8, weight P.  Normal tubes, ovaries and uterus seen.  Technique: Bedside U/S before procedure confirmed Baby in breech presentation.  After adequate spinal anesthesia was achieved, the patient was prepped and draped in usual sterile fashion.  A foley catheter was used to drain the bladder.  A pfannanstiel incision was made with the scalpel and carried down to the fascia with the bovie cautery. The fascia was incised in the midline with the scalpel and carried in a transverse curvilinear manner bilaterally.  The fascia was reflected superiorly and inferiorly off the rectus muscles and the muscles split in the midline.  A bowel free portion of the peritoneum was entered bluntly and then extended in a superior and inferior manner with good visualization of the bowel and bladder.  The Alexis instrument was then placed and the vesico-uterine fascia tented up and incised in a transverse curvilinear manner.  A 2 cm incision was made in the upper portion of the lower uterine segment until the placenta was exposed.  The placenta was retracted superiorly and the amnion  ruptured with an Allis clamp.  Clear fluid was noted and Baby A delivered in the breech presentation without complication.  The baby was bulb suctioned and the cord was clamped and cut.  The baby was then handed to awaiting Neonatology.  Baby B was identified in the vertex presentation and was delivered without complication after rupture of membranes with clear fluid.  Cord bloods were obtained, the placenta was then delivered manually, Baby B cord clamped for identification and the uterus cleared of all debris.  The uterine incision was then closed with a running lock stitch of 0 monocryl.  An imbricating layer of 0 monocryl was closed as well. Good hemostasis of the uterine incision was achieved, the abdomen was cleared with irrigation and the peritoneum was closed with a running stitch of 2-0 vicryl.  This incorporated the rectus muscles as a separate layer.  The fascia was then closed with a running stitch of 0 vicryl.  The subcutaneous layer was closed with interrupted  stitches of 2-0 plain gut.  The skin was closed with staples.  The patient tolerated the procedure well and was returned to the recovery room in stable condition.  All counts were correct times three.  Drayk Humbarger A

## 2011-09-16 NOTE — H&P (Signed)
36 y.o.  [redacted]w[redacted]d   G1P0 comes in for a cesarean section at term secondary twins with A breech presentation.  Patient has good fetal movement and no bleeding.    Past Medical History  Diagnosis Date  . No pertinent past medical history     Past Surgical History  Procedure Date  . Tonsillectomy   . Wisdom tooth extraction   . Breast biopsy     mole removed from breast    OB History    Grav Para Term Preterm Abortions TAB SAB Ect Mult Living   1         0     # Outc Date GA Lbr Len/2nd Wgt Sex Del Anes PTL Lv   1 CUR               History   Social History  . Marital Status: Married    Spouse Name: N/A    Number of Children: N/A  . Years of Education: N/A   Occupational History  . Not on file.   Social History Main Topics  . Smoking status: Never Smoker   . Smokeless tobacco: Never Used  . Alcohol Use: No  . Drug Use: No  . Sexually Active: Yes    Birth Control/ Protection: None     pregnant   Other Topics Concern  . Not on file   Social History Narrative  . No narrative on file   Review of patient's allergies indicates no known allergies.   Prenatal Course: Uncomplicated di/di twins; good growth and concordance.  Filed Vitals:   09/16/11 0808  BP: 119/83  Pulse: 79  Temp: 98.1 F (36.7 C)  Resp: 18     Lungs/Cor:  NAD Abdomen:  soft, gravid Ex:  no cords, erythema SVE:  NA FHTs:  Present x2  A/P   For cesarean sectionat term.  All risks, benefits and alternatives discussed with patient and she desires to proceed.  Yeriel Mineo A

## 2011-09-16 NOTE — Transfer of Care (Signed)
Immediate Anesthesia Transfer of Care Note  Patient: Melinda Reynolds  Procedure(s) Performed: Procedure(s) (LRB): CESAREAN SECTION (N/A)  Patient Location: PACU  Anesthesia Type: Spinal  Level of Consciousness: awake, alert  and oriented  Airway & Oxygen Therapy: Patient Spontanous Breathing  Post-op Assessment: Report given to PACU RN and Post -op Vital signs reviewed and stable  Post vital signs: stable  Complications: No apparent anesthesia complications

## 2011-09-16 NOTE — Brief Op Note (Signed)
09/16/2011  10:40 AM  PATIENT:  Melinda Reynolds  36 y.o. female  PRE-OPERATIVE DIAGNOSIS:  TWINS, baby A is breech  POST-OPERATIVE DIAGNOSIS:  TWINS, baby A is breech  PROCEDURE:  Procedure(s) (LRB): CESAREAN SECTION (N/A)  SURGEON:  Surgeon(s) and Role:    * Rebeca Valdivia A Amaurie Wandel, MD - Primary   ASSISTANTS: none   ANESTHESIA:   spinal  EBL:  Total I/O In: 3700 [I.V.:3700] Out: 1300 [Urine:300; Blood:1000]  SPECIMEN:  Source of Specimen:  placenta  DISPOSITION OF SPECIMEN:  PATHOLOGY  COUNTS:  YES  PLAN OF CARE: Admit to inpatient   PATIENT DISPOSITION:  PACU - hemodynamically stable.   Delay start of Pharmacological VTE agent (>24hrs) due to surgical blood loss or risk of bleeding: not applicable  Complications:  None.  Medications:  Ancef, Pitocin Findings:  Baby A, breech, Apgars 8,10, weight P.   Baby B vtx, Apgars 8,8, weight P.  Normal tubes, ovaries and uterus seen.  Technique: Bedside U/S before procedure confirmed Baby in breech presentation.  After adequate spinal anesthesia was achieved, the patient was prepped and draped in usual sterile fashion.  A foley catheter was used to drain the bladder.  A pfannanstiel incision was made with the scalpel and carried down to the fascia with the bovie cautery. The fascia was incised in the midline with the scalpel and carried in a transverse curvilinear manner bilaterally.  The fascia was reflected superiorly and inferiorly off the rectus muscles and the muscles split in the midline.  A bowel free portion of the peritoneum was entered bluntly and then extended in a superior and inferior manner with good visualization of the bowel and bladder.  The Alexis instrument was then placed and the vesico-uterine fascia tented up and incised in a transverse curvilinear manner.  A 2 cm incision was made in the upper portion of the lower uterine segment until the placenta was exposed.  The placenta was retracted superiorly and the amnion  ruptured with an Allis clamp.  Clear fluid was noted and Baby A delivered in the breech presentation without complication.  The baby was bulb suctioned and the cord was clamped and cut.  The baby was then handed to awaiting Neonatology.  Baby B was identified in the vertex presentation and was delivered without complication after rupture of membranes with clear fluid.  Cord bloods were obtained, the placenta was then delivered manually, Baby B cord clamped for identification and the uterus cleared of all debris.  The uterine incision was then closed with a running lock stitch of 0 monocryl.  An imbricating layer of 0 monocryl was closed as well. Good hemostasis of the uterine incision was achieved, the abdomen was cleared with irrigation and the peritoneum was closed with a running stitch of 2-0 vicryl.  This incorporated the rectus muscles as a separate layer.  The fascia was then closed with a running stitch of 0 vicryl.  The subcutaneous layer was closed with interrupted  stitches of 2-0 plain gut.  The skin was closed with staples.  The patient tolerated the procedure well and was returned to the recovery room in stable condition.  All counts were correct times three.  Shyana Kulakowski A    

## 2011-09-17 LAB — CBC
HCT: 29.2 % — ABNORMAL LOW (ref 36.0–46.0)
Hemoglobin: 9.9 g/dL — ABNORMAL LOW (ref 12.0–15.0)
MCH: 31 pg (ref 26.0–34.0)
MCHC: 33.9 g/dL (ref 30.0–36.0)
MCV: 91.5 fL (ref 78.0–100.0)
RDW: 13.3 % (ref 11.5–15.5)

## 2011-09-17 MED ORDER — HYDROXYZINE HCL 25 MG PO TABS
25.0000 mg | ORAL_TABLET | Freq: Four times a day (QID) | ORAL | Status: DC | PRN
  Administered 2011-09-17 – 2011-09-19 (×7): 25 mg via ORAL
  Filled 2011-09-17 (×7): qty 1

## 2011-09-17 MED ORDER — HYDROMORPHONE HCL 2 MG PO TABS
4.0000 mg | ORAL_TABLET | ORAL | Status: DC | PRN
Start: 2011-09-17 — End: 2011-09-19
  Administered 2011-09-17 – 2011-09-18 (×2): 4 mg via ORAL
  Administered 2011-09-18: 2 mg via ORAL
  Administered 2011-09-18 – 2011-09-19 (×2): 4 mg via ORAL
  Filled 2011-09-17: qty 1
  Filled 2011-09-17: qty 2
  Filled 2011-09-17: qty 1
  Filled 2011-09-17: qty 2
  Filled 2011-09-17: qty 1
  Filled 2011-09-17: qty 2

## 2011-09-17 NOTE — Progress Notes (Signed)
Patient states that the two percocet every 4 hours is only bringing her pain down from an 8 out of 10 to a 7 out of 10 and is very uncomfortable.  VSS, abdomen soft, assessment WNL.  Patient also states that nubain and benadryl ineffective for itching and requests possibly another medication for both pain and itching.  Called Dr. Arlyce Dice to request medication changes.  Dr. Arlyce Dice to order new meds.  Will continue to monitor.  Earl Gala, Linda Hedges [place]

## 2011-09-17 NOTE — Progress Notes (Signed)
Post Op Day 1 Subjective: no complaints  Objective: Blood pressure 123/74, pulse 65, temperature 98.4 F (36.9 C), breastfeeding.  Physical Exam:  General: alert Lochia: appropriate Uterine Fundus: firm Incision: no significant drainage   Basename 09/17/11 0500 09/14/11 1112  HGB 9.9* 13.9  HCT 29.2* 39.7    Assessment/Plan: Observe   LOS: 1 day   Sirron Francesconi D 09/17/2011, 10:56 AM

## 2011-09-17 NOTE — Progress Notes (Signed)

## 2011-09-18 ENCOUNTER — Encounter (HOSPITAL_COMMUNITY)
Admission: RE | Admit: 2011-09-18 | Discharge: 2011-09-18 | Disposition: A | Discharge status: Home / Self Care | Payer: 59 | Source: Ambulatory Visit | Attending: Obstetrics and Gynecology | Admitting: Obstetrics and Gynecology

## 2011-09-18 ENCOUNTER — Encounter (HOSPITAL_COMMUNITY): Payer: Self-pay | Admitting: Obstetrics and Gynecology

## 2011-09-18 DIAGNOSIS — O923 Agalactia: Secondary | ICD-10-CM | POA: Insufficient documentation

## 2011-09-18 MED ORDER — OXYCODONE-ACETAMINOPHEN 5-325 MG PO TABS
1.0000 | ORAL_TABLET | ORAL | Status: DC | PRN
  Administered 2011-09-18 – 2011-09-19 (×2): 2 via ORAL
  Filled 2011-09-18 (×2): qty 2

## 2011-09-18 NOTE — Progress Notes (Signed)
Patient's pain is not controlled well.  She refuses to take the ordered Dilaudid and only agrees to taking Ibuprofen.  She verbalizes that narcotics make her itch, and she "would rather be in pain than itch".  She also has hydroxyzine ordered which relieves her itching.  Patient has been offered Dilaudid several times for her pain, and has been educated about pain control.  She still verbalizes she would rather only take Ibuprofen.  Pain currently a 4 out of 10, and she is sitting up in the chair comfortable feeding her baby.  The worse her pain has been during the shift was an 8 out of 10.  Offered to call MD during the earlier part of the shift, but patient wanted to wait and see how she felt throughout the night before any of her medications were changed again.

## 2011-09-18 NOTE — Progress Notes (Signed)
Post Op Day 2 Subjective: no complaints, up ad lib, voiding, tolerating PO and + flatus, pruritis has resolved.  Objective: Blood pressure 117/78, pulse 74, temperature 98.1 F (36.7 C),  breastfeeding.  Physical Exam:  General: alert Lochia: appropriate Uterine Fundus: firm Incision: healing well   Basename 09/17/11 0500  HGB 9.9*  HCT 29.2*    Assessment/Plan: Plan for discharge tomorrow   LOS: 2 days   Melinda Reynolds 09/18/2011, 9:09 AM

## 2011-09-19 MED ORDER — HYDROMORPHONE HCL 4 MG PO TABS: 4.0000 mg | ORAL_TABLET | ORAL | Status: AC | PRN

## 2011-09-19 MED ORDER — HYDROXYZINE HCL 25 MG PO TABS: 25.0000 mg | ORAL_TABLET | Freq: Four times a day (QID) | ORAL | Status: AC | PRN

## 2011-09-19 NOTE — Discharge Instructions (Signed)

## 2011-09-19 NOTE — Discharge Summary (Signed)
Obstetric Discharge Summary Reason for Admission: cesarean section Prenatal Procedures: ultrasound Intrapartum Procedures: cesarean: low cervical, transverse Postpartum Procedures: none Complications-Operative and Postpartum: none Hemoglobin  Date Value Range Status  09/17/2011 9.9* 12.0-15.0 (g/dL) Final     HCT  Date Value Range Status  09/17/2011 29.2* 36.0-46.0 (%) Final    Physical Exam:  General: alert Lochia: appropriate Uterine Fundus: firm Incision: healing well  Discharge Diagnoses: Term Pregnancy-delivered and Breech, breech twin presentation  Discharge Information: Date: 09/19/2011 Activity: limited Diet: routine Medications: PNV, Ibuprofen, Dilaudid, and Atarax Condition: stable Instructions: refer to practice specific booklet Discharge to: home Follow-up Information    Follow up with HORVATH,MICHELLE A, MD in 4 weeks.   Contact information:   [address] [place] [address] [phone]          Newborn Data:   Margert, Edsall [[id number]]  Live born female  Birth Weight: 6 lb 3.7 oz (2825 g) APGAR: 8, 10   Momo, Braun [[id number]]  Live born female  Birth Weight: 5 lb 12.2 oz (2615 g) APGAR: 8, 8  Home with mother.  Melinda Reynolds D 09/19/2011, 10:59 AM

## 2011-09-20 ENCOUNTER — Encounter (HOSPITAL_COMMUNITY): Payer: 59

## 2011-10-19 ENCOUNTER — Encounter (HOSPITAL_COMMUNITY)
Admission: RE | Admit: 2011-10-19 | Discharge: 2011-10-19 | Disposition: A | Discharge status: Home / Self Care | Payer: 59 | Source: Ambulatory Visit | Attending: Obstetrics and Gynecology | Admitting: Obstetrics and Gynecology

## 2011-10-19 DIAGNOSIS — O923 Agalactia: Secondary | ICD-10-CM | POA: Insufficient documentation

## 2011-10-29 ENCOUNTER — Telehealth: Payer: Self-pay | Admitting: Family Medicine

## 2011-10-29 ENCOUNTER — Encounter: Payer: Self-pay | Admitting: Family Medicine

## 2011-10-29 ENCOUNTER — Ambulatory Visit (INDEPENDENT_AMBULATORY_CARE_PROVIDER_SITE_OTHER): Payer: 59 | Admitting: Family Medicine

## 2011-10-29 VITALS — BP 114/74 | HR 71 | Ht 71.0 in | Wt 166.0 lb

## 2011-10-29 DIAGNOSIS — F329 Major depressive disorder, single episode, unspecified: Secondary | ICD-10-CM

## 2011-10-29 DIAGNOSIS — F32A Depression, unspecified: Secondary | ICD-10-CM

## 2011-10-29 DIAGNOSIS — K148 Other diseases of tongue: Secondary | ICD-10-CM

## 2011-10-29 DIAGNOSIS — I8390 Asymptomatic varicose veins of unspecified lower extremity: Secondary | ICD-10-CM

## 2011-10-29 MED ORDER — SERTRALINE HCL 50 MG PO TABS: ORAL_TABLET | ORAL | Status: DC

## 2011-10-29 MED ORDER — BUPROPION HCL ER (XL) 150 MG PO TB24: 150.0000 mg | ORAL_TABLET | Freq: Every day | ORAL | Status: DC

## 2011-10-29 NOTE — Patient Instructions (Signed)
Try wearing compression stocking. Can refer to [facility] and vascular if needed.

## 2011-10-29 NOTE — Telephone Encounter (Signed)
Pt.notified

## 2011-10-29 NOTE — Telephone Encounter (Signed)
Call patient: Tell her I was unable to find any  tongue lesion  exactly like the lesion on her tongue. I would like to go ahead and see her dentist. They may want to biopsy it just to be 100% sure that it is a benign lesion.

## 2011-10-29 NOTE — Progress Notes (Signed)
Subjective:    Patient ID: Melinda Reynolds, female    DOB: 04-Oct-1975, 36 y.o.   MRN: [id number]  HPI After c/sec had swelling in her feet and legs and that resolved.  Her right foot has numbness near her heel.  That started during her pregnancy and has continue.  On her right foot behind her heel she has some dilated vessels and feels they are large. Concnered about the pain. She says that they burn after being up for only about 2 minutes. She's had no recent significant swelling.  Has a white spot on the front of her tongue. Worse when eats saltly foods. Thinks started 2 mo ago. Has changed. No other lesions.  Feels like an ulcer.  Hasn't tried anything.    She wants to restart her zoloft and wellbutrin. Not nursing.  She is feeling very anxious and stressed with her twins.  No thoughts of harming herself or her baby's. Just feels very tired and exhausted because not sleeping well because of her 62-week-old.    Review of Systems     Objective:   Physical Exam  Constitutional: She is oriented to person, place, and time. She appears well-developed and well-nourished.  HENT:  Head: Normocephalic and atraumatic.       On the left tip of her time she has what appears to be a hyperkeratotic or almost wartlike white pointed lesion. It is approximately 1 mm in size. An is about 1-2 mm in height.  Neurological: She is alert and oriented to person, place, and time.  Skin: Skin is warm and dry.       She does have some spider veins and varicose veins around her ankles bilaterally. None of them are hot red or tender. She also has a area at the anterior portion of the heel on her right foot that has decreased sensation. There is no abnormality masses or skin changes. Her ankle and foot has a normal range of motion with normal strength. Dorsal pedal pulses and posterior tibial pulses are 2+ on the right.  Psychiatric: She has a normal mood and affect. Her behavior is normal.          Assessment & Plan:   Varicose veins -  we discussed that the first treatment is usually compression stockings. She can certainly try some over-the-counter at [facility]. If she would like a prescription for more medical grade stockings  we will be happy to do that for her. If at some point she is not getting significant relief or she just cosmetically wants them treated, I would be happy to refer her to the vein a vascular Center in [place] or in [place]. I did reassure her that they can sometimes burn and ache and it is be completely normal. Is usually exacerbated by being up on her feet.  Tongue lesion-unclear etiology. It appears to be almost wartlike or even hyperkeratotic. Did recommend she try scheduling with her dentist. I'm not exactly sure what this is. It is most likely benign I must wonder if it could be viral in origin. It may even need to be biopsied just to rule out oral cancer but she is definitely low risk for oral cancer.  History of depression and anxiety-we will restart her Wellbutrin and her Zoloft together. She says that was the best combination for her. She says the Zoloft caused decreased libido and the Wellbutrin counteracted that.. She was up to 300 mg on the Wellbutrin but we will start with 150 extend  release. Followup in 6 weeks to make sure she is tolerating well and to see if we need to adjust her dose.

## 2011-11-02 ENCOUNTER — Ambulatory Visit (INDEPENDENT_AMBULATORY_CARE_PROVIDER_SITE_OTHER): Payer: 59 | Admitting: *Deleted

## 2011-11-02 DIAGNOSIS — I781 Nevus, non-neoplastic: Secondary | ICD-10-CM

## 2011-12-10 ENCOUNTER — Ambulatory Visit: Payer: 59 | Admitting: Family Medicine

## 2012-01-03 ENCOUNTER — Ambulatory Visit (INDEPENDENT_AMBULATORY_CARE_PROVIDER_SITE_OTHER): Payer: 59 | Admitting: Family Medicine

## 2012-01-03 ENCOUNTER — Encounter: Payer: Self-pay | Admitting: Family Medicine

## 2012-01-03 VITALS — BP 115/64 | HR 61 | Ht 71.0 in | Wt 159.0 lb

## 2012-01-03 DIAGNOSIS — Z0000 Encounter for general adult medical examination without abnormal findings: Secondary | ICD-10-CM

## 2012-01-03 MED ORDER — BUPROPION HCL ER (XL) 150 MG PO TB24: 150.0000 mg | ORAL_TABLET | Freq: Every day | ORAL | Status: DC

## 2012-01-03 MED ORDER — SERTRALINE HCL 50 MG PO TABS: 50.0000 mg | ORAL_TABLET | Freq: Every day | ORAL | Status: DC

## 2012-01-03 NOTE — Progress Notes (Signed)
  Subjective:     Melinda Reynolds is a 36 y.o. female and is here for a comprehensive physical exam. The patient reports no problems.  REcently had full skin check with her dermatologist.  Has gyn appt scheduled in next couple of months.   History   Social History  . Marital Status: Married    Spouse Name: N/A    Number of Children: N/A  . Years of Education: N/A   Occupational History  . Not on file.   Social History Main Topics  . Smoking status: Never Smoker   . Smokeless tobacco: Never Used  . Alcohol Use: No  . Drug Use: No  . Sexually Active: Yes    Birth Control/ Protection: None     pregnant   Other Topics Concern  . Not on file   Social History Narrative  . No narrative on file   Health Maintenance  Topic Date Due  . Influenza Vaccine  03/21/2012  . Pap Smear  01/19/2014  . Tetanus/tdap  09/16/2021    The following portions of the patient's history were reviewed and updated as appropriate: allergies, current medications, past family history, past medical history, past social history, past surgical history and problem list.  Review of Systems A comprehensive review of systems was negative.   Objective:    BP 115/64  Pulse 61  Ht 5\' 11"  (1.803 m)  Wt 159 lb (72.122 kg)  BMI 22.18 kg/m2 General appearance: alert, cooperative and appears stated age Head: Normocephalic, without obvious abnormality, atraumatic Eyes: conj clear, EOMi, PEERLA Ears: normal TM's and external ear canals both ears Nose: Nares normal. Septum midline. Mucosa normal. No drainage or sinus tenderness. Throat: lips, mucosa, and tongue normal; teeth and gums normal Neck: no adenopathy, no carotid bruit, no JVD, supple, symmetrical, trachea midline and thyroid not enlarged, symmetric, no tenderness/mass/nodules Back: symmetric, no curvature. ROM normal. No CVA tenderness. Lungs: clear to auscultation bilaterally Heart: regular rate and rhythm, S1, S2 normal, no murmur, click, rub or  gallop Abdomen: soft, non-tender; bowel sounds normal; no masses,  no organomegaly Extremities: extremities normal, atraumatic, no cyanosis or edema Pulses: 2+ and symmetric Skin: Skin color, texture, turgor normal. No rashes or lesions Lymph nodes: Cervical, supraclavicular, and axillary nodes normal. Neurologic: Grossly normal , reflexes 2+ in the UE nad LE.    Assessment:    Healthy female exam.      Plan:    Start a regular exercise program and make sure you are eating a healthy diet Try to eat 4 servings of dairy a day  Screening labs, CMP and lipids.  Your vaccines are up to date.   Acute depression- REfilled meds for 6 months. Doing well on regimen. No SE. F/U in 6 months.  Twin babies are starting to sleep through the night.

## 2012-01-03 NOTE — Patient Instructions (Addendum)
We will call you with your lab results. If you don't here from us in about a week then please give us a call at [phone]. Start a regular exercise program and make sure you are eating a healthy diet Try to eat 4 servings of dairy a day . Your vaccines are up to date.   

## 2012-01-18 ENCOUNTER — Encounter: Payer: 59 | Admitting: Vascular Surgery

## 2012-01-29 ENCOUNTER — Other Ambulatory Visit: Payer: Self-pay | Admitting: Family Medicine

## 2012-04-14 ENCOUNTER — Encounter: Payer: Self-pay | Admitting: Family Medicine

## 2012-04-14 ENCOUNTER — Ambulatory Visit (INDEPENDENT_AMBULATORY_CARE_PROVIDER_SITE_OTHER): Payer: 59 | Admitting: Family Medicine

## 2012-04-14 VITALS — BP 110/69 | HR 61 | Ht 71.0 in | Wt 156.0 lb

## 2012-04-14 DIAGNOSIS — Z8249 Family history of ischemic heart disease and other diseases of the circulatory system: Secondary | ICD-10-CM | POA: Insufficient documentation

## 2012-04-14 DIAGNOSIS — R0789 Other chest pain: Secondary | ICD-10-CM

## 2012-04-14 DIAGNOSIS — Z23 Encounter for immunization: Secondary | ICD-10-CM

## 2012-04-14 LAB — CBC WITH DIFFERENTIAL/PLATELET
Basophils Absolute: 0.1 10*3/uL (ref 0.0–0.1)
Eosinophils Absolute: 0.1 10*3/uL (ref 0.0–0.7)
Eosinophils Relative: 2 % (ref 0–5)
HCT: 42.2 % (ref 36.0–46.0)
Lymphocytes Relative: 27 % (ref 12–46)
Lymphs Abs: 1.4 10*3/uL (ref 0.7–4.0)
MCH: 29.5 pg (ref 26.0–34.0)
MCV: 87.2 fL (ref 78.0–100.0)
Monocytes Absolute: 0.4 10*3/uL (ref 0.1–1.0)
RDW: 12.5 % (ref 11.5–15.5)
WBC: 5.2 10*3/uL (ref 4.0–10.5)

## 2012-04-14 LAB — LIPID PANEL
LDL Cholesterol: 126 mg/dL — ABNORMAL HIGH (ref 0–99)
Triglycerides: 54 mg/dL (ref <150)
VLDL: 11 mg/dL (ref 0–40)

## 2012-04-14 NOTE — Progress Notes (Signed)
Subjective:    Patient ID: Melinda Reynolds, female    DOB: August 24, 1975, 36 y.o.   MRN: [id number]  HPI Pain in center of left chest. Says happens daily but comes and goes during the day. It will come on and then last last for 10-15 seconds.  Not sharp. No SOB.  Duration 2 months.  No relationship to eating or not eating. No N/V. No prior heart problems.  + fam hx on dad's side.  No worsening or alleviating sxs.  Can happen at rest or with activity.  Says not sure if stress related. Home taking care of her twins.  No palps.    Review of Systems BP 110/69  Pulse 61  Ht 5\' 11"  (1.803 m)  Wt 156 lb (70.761 kg)  BMI 21.76 kg/m2    No Known Allergies  Past Medical History  Diagnosis Date  . No pertinent past medical history     Past Surgical History  Procedure Date  . Tonsillectomy   . Wisdom tooth extraction   . Breast biopsy     mole removed from breast  . Cesarean section 09/16/2011    Procedure: CESAREAN SECTION;  Surgeon: Loney Laurence, MD;  Location: [facility];  Service: Gynecology;  Laterality: N/A;  Twins    History   Social History  . Marital Status: Married    Spouse Name: Gregary Signs      Number of Children: 2  . Years of Education: N/A   Occupational History  . Stay at home mom.     Social History Main Topics  . Smoking status: Never Smoker   . Smokeless tobacco: Never Used  . Alcohol Use: 4.2 oz/week    7 Glasses of wine per week  . Drug Use: No  . Sexually Active: Yes -- Female partner(s)    Birth Control/ Protection: None     pregnant   Other Topics Concern  . Not on file   Social History Narrative   No regular exercise.      Family History  Problem Relation Age of Onset  . Heart attack Father   . Hyperlipidemia Father   . Lung cancer Father     small cell  . Diabetes Maternal Grandfather     Outpatient Encounter Prescriptions as of 04/14/2012  Medication Sig Dispense Refill  . sertraline (ZOLOFT) 50 MG tablet Take 1 tablet (50 mg total) by mouth  daily.  90 tablet  1  . WELLBUTRIN XL 150 MG 24 hr tablet TAKE ONE TABLET BY MOUTH EVERY DAY  30 each  1  . Prenatal Vit-Fe Fumarate-FA (PRENATAL MULTIVITAMIN) TABS Take 1 tablet by mouth every morning.      Marland Kitchen DISCONTD: buPROPion (WELLBUTRIN XL) 150 MG 24 hr tablet Take 1 tablet (150 mg total) by mouth daily.  90 tablet  1          Objective:   Physical Exam  Constitutional: She is oriented to person, place, and time. She appears well-developed and well-nourished.  HENT:  Head: Normocephalic and atraumatic.  Right Ear: External ear normal.  Left Ear: External ear normal.  Nose: Nose normal.  Mouth/Throat: Oropharynx is clear and moist.       TMs and canals are clear.   Eyes: Conjunctivae normal and EOM are normal. Pupils are equal, round, and reactive to light.  Neck: Neck supple. No thyromegaly present.  Cardiovascular: Normal rate, regular rhythm and normal heart sounds.        No carotid bruits.  Pulmonary/Chest:  Effort normal and breath sounds normal. She has no wheezes. She exhibits no tenderness.  Abdominal: Soft. Bowel sounds are normal. She exhibits no distension and no mass. There is no tenderness. There is no rebound and no guarding.  Lymphadenopathy:    She has no cervical adenopathy.  Neurological: She is alert and oriented to person, place, and time.  Skin: Skin is warm and dry.  Psychiatric: She has a normal mood and affect.          Assessment & Plan:  Atypical Chest pain - unclear etiology. Not likely to be cardiac based on her description of her pain and discomfort. It does not sound GI related either. Also does not sound pulmonary related. She does have a premature family history of heart disease I do think we should better risk stratify her at least get her cholesterol level on her. Blood pressures very well controlled. EKG shows normal sinus rhythm with slight rightward axis deviation. No acute changes. We will also reevaluate thyroid, and evaluate for  possible anemia which can also cause chest pain. Also evaluate electrolytes. If all is normal and reassuring in certainly this could be stress related since she has been going through a lot lately. Call if symptoms change in nature, become more frequent or intense.

## 2012-04-15 LAB — COMPLETE METABOLIC PANEL WITH GFR
ALT: 21 U/L (ref 0–35)
AST: 21 U/L (ref 0–37)
CO2: 30 mEq/L (ref 19–32)
Calcium: 9.5 mg/dL (ref 8.4–10.5)
Chloride: 104 mEq/L (ref 96–112)
Creat: 0.9 mg/dL (ref 0.50–1.10)
Potassium: 4.7 mEq/L (ref 3.5–5.3)
Sodium: 139 mEq/L (ref 135–145)
Total Protein: 6.3 g/dL (ref 6.0–8.3)

## 2012-04-17 ENCOUNTER — Encounter: Payer: Self-pay | Admitting: *Deleted

## 2012-04-17 ENCOUNTER — Telehealth: Payer: Self-pay | Admitting: *Deleted

## 2012-04-17 NOTE — Telephone Encounter (Signed)
Message copied by Florestine Avers on Mon Apr 17, 2012 10:43 AM ------      Message from: Nani Gasser D      Created: Sun Apr 16, 2012  8:47 PM       Call pt: LDL was high. Goal is under 100. CMP and thyroid is nl. CBC was normal and sed rate was normal. No sign of anemia or inflammation. NOthing to explain her chest pain.  If persists  Over the next week then please call me.

## 2012-04-20 ENCOUNTER — Other Ambulatory Visit: Payer: Self-pay | Admitting: Obstetrics and Gynecology

## 2012-05-11 ENCOUNTER — Telehealth: Payer: Self-pay | Admitting: Family Medicine

## 2012-05-11 ENCOUNTER — Ambulatory Visit: Payer: 59 | Admitting: Family Medicine

## 2012-05-11 MED ORDER — BUPROPION HCL ER (XL) 150 MG PO TB24: 150.0000 mg | ORAL_TABLET | Freq: Every day | ORAL | Status: DC

## 2012-05-11 NOTE — Telephone Encounter (Signed)
Patient called had appointment today but has a flat tire and she has rescheduled appt for 05/15/12 but needs a refill for Wellbutrin today she is completely out... Thanks

## 2012-05-15 ENCOUNTER — Encounter: Payer: Self-pay | Admitting: Family Medicine

## 2012-05-15 ENCOUNTER — Ambulatory Visit (INDEPENDENT_AMBULATORY_CARE_PROVIDER_SITE_OTHER): Payer: 59 | Admitting: Family Medicine

## 2012-05-15 VITALS — BP 122/65 | HR 67 | Ht 71.0 in | Wt 153.0 lb

## 2012-05-15 DIAGNOSIS — F411 Generalized anxiety disorder: Secondary | ICD-10-CM

## 2012-05-15 DIAGNOSIS — R0789 Other chest pain: Secondary | ICD-10-CM

## 2012-05-15 MED ORDER — SERTRALINE HCL 100 MG PO TABS: 100.0000 mg | ORAL_TABLET | Freq: Every day | ORAL | Status: DC

## 2012-05-15 MED ORDER — BUPROPION HCL ER (XL) 300 MG PO TB24: 300.0000 mg | ORAL_TABLET | Freq: Every day | ORAL | Status: DC

## 2012-05-15 NOTE — Progress Notes (Signed)
  Subjective:    Patient ID: Melinda Reynolds, female    DOB: 1976-01-05, 36 y.o.   MRN: [id number]  HPI Increased her sertraline to 100mg  and wellbutrin to 300mg  and says hasn't had anymore chest pain, so feels it was likley stress related. She is sleeping well.  No depression sxs. She is doing well on new regimen. I had seen her a couple weeks ago for atypical chest pain. EKG and blood work was all completely normal. Her symptoms have resolved.  She has started working out about 3 weeks ago.   Review of Systems     Objective:   Physical Exam  Constitutional: She is oriented to person, place, and time. She appears well-developed and well-nourished.  HENT:  Head: Normocephalic and atraumatic.  Cardiovascular: Normal rate, regular rhythm and normal heart sounds.   Pulmonary/Chest: Effort normal and breath sounds normal.  Neurological: She is alert and oriented to person, place, and time.  Skin: Skin is warm and dry.  Psychiatric: She has a normal mood and affect. Her behavior is normal.          Assessment & Plan:  GAD-gad 7 score of 8 today. She's happy with her current regimen so I wrote new prescriptions for sertraline 100 mg and Wellbutrin 300 mg extended release. I did discuss increased risk of serotonin syndrome and reviewed symptoms to monitor for her. If she experiences any of this I recommended she start with having her sertraline in half and please give [place] a call. Otherwise followup in 3 months. Call if any palms or concerns.  Atypical chest pain-resolved. Most likely mood related at this point but if it recurs I encouraged her to call me back so that we can work her up further.

## 2012-09-12 ENCOUNTER — Other Ambulatory Visit: Payer: Self-pay | Admitting: Podiatry

## 2012-11-23 ENCOUNTER — Other Ambulatory Visit: Payer: Self-pay | Admitting: Family Medicine

## 2012-11-28 ENCOUNTER — Other Ambulatory Visit: Payer: Self-pay | Admitting: *Deleted

## 2012-11-28 MED ORDER — BUPROPION HCL ER (XL) 300 MG PO TB24: 300.0000 mg | ORAL_TABLET | Freq: Every day | ORAL | Status: DC

## 2012-11-30 ENCOUNTER — Encounter: Payer: Self-pay | Admitting: Family Medicine

## 2012-11-30 ENCOUNTER — Ambulatory Visit (INDEPENDENT_AMBULATORY_CARE_PROVIDER_SITE_OTHER): Payer: 59 | Admitting: Family Medicine

## 2012-11-30 VITALS — BP 116/62 | HR 56 | Wt 149.0 lb

## 2012-11-30 DIAGNOSIS — F32A Depression, unspecified: Secondary | ICD-10-CM

## 2012-11-30 DIAGNOSIS — F329 Major depressive disorder, single episode, unspecified: Secondary | ICD-10-CM

## 2012-11-30 MED ORDER — SERTRALINE HCL 50 MG PO TABS: 50.0000 mg | ORAL_TABLET | Freq: Every day | ORAL | Status: DC

## 2012-11-30 MED ORDER — BUPROPION HCL ER (XL) 300 MG PO TB24: 300.0000 mg | ORAL_TABLET | Freq: Every day | ORAL | Status: DC

## 2012-11-30 NOTE — Progress Notes (Signed)
  Subjective:    Patient ID: Melinda Reynolds, female    DOB: 19-Aug-1975, 37 y.o.   MRN: [id number]  HPI Depressoin - doing well overall. Happy with current regimen. Did get nightsweats on the 100mg  sertraline so dec to 50mg . this has worked much better and the night sweats have  improved. She says she still has moments of feeling stressed taking care of her twin 72 month old. But overall she is doing much better and her sleep quality is much improved as well. Her stress level still under much better control.   Review of Systems     Objective:   Physical Exam  Constitutional: She is oriented to person, place, and time. She appears well-developed and well-nourished.  HENT:  Head: Normocephalic and atraumatic.  Cardiovascular: Normal rate, regular rhythm and normal heart sounds.   Pulmonary/Chest: Effort normal and breath sounds normal.  Neurological: She is alert and oriented to person, place, and time.  Skin: Skin is warm and dry.  Psychiatric: She has a normal mood and affect. Her behavior is normal.          Assessment & Plan:  Depression  Well controlled. - overall she's doing fantastic. She wants to continue current regimen of sertraline 50 mg and Wellbutrin 300 mg. Refill sent to pharmacy for 6 months. We also discussed at some point trying to wean her off when she feels she's ready to do that she says she's not quite ready. Followup in 6 months.  PHQ- 9 score of 2.

## 2013-05-03 ENCOUNTER — Other Ambulatory Visit: Payer: Self-pay | Admitting: Obstetrics and Gynecology

## 2013-05-30 ENCOUNTER — Encounter: Payer: Self-pay | Admitting: Family Medicine

## 2013-05-30 ENCOUNTER — Ambulatory Visit (INDEPENDENT_AMBULATORY_CARE_PROVIDER_SITE_OTHER): Payer: BC Managed Care – PPO | Admitting: Family Medicine

## 2013-05-30 VITALS — BP 112/68 | HR 71 | Resp 16 | Wt 152.0 lb

## 2013-05-30 DIAGNOSIS — F32A Depression, unspecified: Secondary | ICD-10-CM

## 2013-05-30 DIAGNOSIS — F329 Major depressive disorder, single episode, unspecified: Secondary | ICD-10-CM

## 2013-05-30 MED ORDER — BUPROPION HCL ER (XL) 300 MG PO TB24: 300.0000 mg | ORAL_TABLET | Freq: Every day | ORAL | Status: DC

## 2013-05-30 MED ORDER — SERTRALINE HCL 50 MG PO TABS: 50.0000 mg | ORAL_TABLET | Freq: Every day | ORAL | Status: DC

## 2013-05-30 NOTE — Progress Notes (Signed)
   Subjective:    Patient ID: Melinda Reynolds, female    DOB: [birth year], 37 y.o.   MRN: [id number]  HPI  Depression - here for depression followup-overall she's doing fantastic on her current regimen. She wants to continue it. She's not ready to wean off of it. Her twins are now 23 months old she is a stay-at-home mom. She still struggles the most with irritability. And says the medication makes a big difference in helping keep her more calm especially since she is home alone with her children who she really loves but sometimes drive her crazy. She has not experienced any side effects on the medication. She says her father was on medication for years for depression.  Review of Systems     Objective:   Physical Exam  Constitutional: She is oriented to person, place, and time. She appears well-developed and well-nourished.  HENT:  Head: Normocephalic and atraumatic.  Cardiovascular: Normal rate, regular rhythm and normal heart sounds.   Pulmonary/Chest: Effort normal and breath sounds normal.  Neurological: She is alert and oriented to person, place, and time.  Skin: Skin is warm and dry.  Psychiatric: She has a normal mood and affect. Her behavior is normal.          Assessment & Plan:  Depression - continue current regimen per patient preference. Well-controlled. She's doing absolutely fantastic on it. Her PHQ 9 score was 1, which was feeling tired. Her gad 7 score was 3 today for irritability and feeling nervous and on edge several days a week. Followup in 9 months. Call if any problems. 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 I think this would be fantastic at any point that she is ready to do this please let me know and we will place a referral.

## 2013-07-31 ENCOUNTER — Other Ambulatory Visit: Payer: Self-pay | Admitting: Family Medicine

## 2014-01-14 ENCOUNTER — Ambulatory Visit (INDEPENDENT_AMBULATORY_CARE_PROVIDER_SITE_OTHER): Payer: BC Managed Care – PPO | Admitting: Sports Medicine

## 2014-01-14 ENCOUNTER — Encounter: Payer: Self-pay | Admitting: Sports Medicine

## 2014-01-14 VITALS — BP 127/76 | Ht 71.0 in | Wt 151.0 lb

## 2014-01-14 DIAGNOSIS — M25569 Pain in unspecified knee: Secondary | ICD-10-CM

## 2014-01-14 DIAGNOSIS — M25561 Pain in right knee: Secondary | ICD-10-CM

## 2014-01-14 NOTE — Progress Notes (Signed)
   Subjective:    Patient ID: Melinda Reynolds, female    DOB: 10/20/1975, 38 y.o.   MRN: [id number]  HPI chief complaint: Right knee pain  Very pleasant 38 year old female comes in today complaining of 2 months of medial sided right knee pain. She denies any trauma. She describes a sharp pain along the medial knee which is present with running as well as with squats and lunges. She will also occasionally get pain with going up stairs. She first noticed the pain many years ago when trying to run and as a consequence gave up running until a couple of months ago. She experienced returning pain with running at that time. Pain begins immediately. No swelling. No locking or catching. No radiating pain. No pain at rest. No prior knee surgery. No hip pain.  Past medical history reviewed. Current medications reviewed. No known drug allergies    Review of Systems     Objective:   Physical Exam Well-developed, well-nourished. No acute distress. Awake alert and oriented x3. Vital signs are reviewed.  Right knee: Full range of motion. Good strength. No effusion. There is tenderness to palpation underneath the medial patellar facet. No tenderness along the lateral facet. Negative J. sign. No joint line tenderness. Negative McMurray's. Knee is stable to ligamentous exam. She has good hip abductor strength. Neutral arch with standing. Patient failed to bring her running shoes with her today.       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 I am going to get x-rays of this knee including a sunrise view but I suspect that her problem is more dynamic. I've given her some home exercises (VMO strengthening, half squats, hamstring strengthening, and hip abductor strengthening). She will do these exercises 4-5 times a week and followup with me in 3 weeks. We will review her x-rays at that time. She will bring her  running shoes to her next appointment and we will do a running analysis.

## 2014-01-16 ENCOUNTER — Other Ambulatory Visit: Payer: Self-pay | Admitting: Sports Medicine

## 2014-01-16 ENCOUNTER — Ambulatory Visit (INDEPENDENT_AMBULATORY_CARE_PROVIDER_SITE_OTHER): Payer: BC Managed Care – PPO

## 2014-01-16 DIAGNOSIS — M25569 Pain in unspecified knee: Secondary | ICD-10-CM

## 2014-01-16 DIAGNOSIS — M25561 Pain in right knee: Secondary | ICD-10-CM

## 2014-02-11 ENCOUNTER — Ambulatory Visit: Payer: BC Managed Care – PPO | Admitting: Sports Medicine

## 2014-03-05 ENCOUNTER — Other Ambulatory Visit: Payer: Self-pay

## 2014-03-05 MED ORDER — BUPROPION HCL ER (XL) 300 MG PO TB24: 300.0000 mg | ORAL_TABLET | Freq: Every day | ORAL | Status: DC

## 2014-03-05 MED ORDER — SERTRALINE HCL 50 MG PO TABS: 50.0000 mg | ORAL_TABLET | Freq: Every day | ORAL | Status: DC

## 2014-03-05 NOTE — Telephone Encounter (Signed)
Refilled medications for 90 days after scheduling patient for a follow up office visit.

## 2014-04-02 ENCOUNTER — Ambulatory Visit (INDEPENDENT_AMBULATORY_CARE_PROVIDER_SITE_OTHER): Payer: BC Managed Care – PPO | Admitting: Family Medicine

## 2014-04-02 ENCOUNTER — Encounter: Payer: Self-pay | Admitting: Family Medicine

## 2014-04-02 VITALS — BP 125/66 | HR 72 | Ht 71.0 in | Wt 159.0 lb

## 2014-04-02 DIAGNOSIS — N644 Mastodynia: Secondary | ICD-10-CM

## 2014-04-02 DIAGNOSIS — F32A Depression, unspecified: Secondary | ICD-10-CM

## 2014-04-02 DIAGNOSIS — F329 Major depressive disorder, single episode, unspecified: Secondary | ICD-10-CM | POA: Diagnosis not present

## 2014-04-02 DIAGNOSIS — Z13220 Encounter for screening for lipoid disorders: Secondary | ICD-10-CM

## 2014-04-02 DIAGNOSIS — R5383 Other fatigue: Secondary | ICD-10-CM | POA: Diagnosis not present

## 2014-04-02 NOTE — Progress Notes (Signed)
   Subjective:    Patient ID: Melinda Reynolds, female    DOB: 01/04/1976, 38 y.o.   MRN: [id number]  HPI Followup depression-still complains of some fatigue and difficulty with sleep but overall denies any feeling down or depressed. She has no thoughts of harming herself. She denies any anxiety symptoms. She's currently on Wellbutrin 300 mg daily and sertraline 50 mg daily. + fatigue.   R breast only no trauma,injury,redness,swelling or d/c she reports that the breast feels achy and that it last for 5-10 seconds.  Sxs started 3 weeks ago.  Worse medially but really affects the whole breast. Doesn't feel any lumps. She fnished her cycle.  No change in exercise levels.  No change in bra wear. Drinks 1-2 coffee drinks per day. No chance pregnancy. No family hx of breast cancer.  No nipple d/c.   Review of Systems She also complains about fatigue. She does have 37-year-old twins. She says she gets about 7 hours of sleep at night that the children to sleep with her and her husband. She denies any swollen lymph nodes.    Objective:   Physical Exam  Constitutional: She is oriented to person, place, and time. She appears well-developed and well-nourished.  HENT:  Head: Normocephalic and atraumatic.  Cardiovascular: Normal rate, regular rhythm and normal heart sounds.   Pulmonary/Chest: Effort normal and breath sounds normal.  Right breast with no palpable nodules or masses. No erythema.  No LN in the axilla.   Neurological: She is alert and oriented to person, place, and time.  Skin: Skin is warm and dry.  Psychiatric: She has a normal mood and affect. Her behavior is normal.          Assessment & Plan:  Depression-PHQ 9 score of 4 but not significant as the first 2 questions were negative. Gad 7 score of 0, previous of 3. At this point she is ready to start weaning the medication. We will decrease her sertraline and wean off of the next 3 weeks. She will stay on Wellbutrin for 3 months. If she's  doing well and stable as far as her mood is concerned at that point and we can start to wean the Wellbutrin. Patient agrees to care plan. She can call me in 3 months if she , is ready to start weaning. Otherwise I will see her in 6 months  Right Br pain - Unknown cause at this point. No known triggers. Exam is benign to Will schedule for diagnostic mammo.    Fatigue - we discussed working on making sure that she's getting adequate sleep and rest. Managing stress levels. We will do some blood work including a CBC and thyroid. We'll call with results once available.

## 2014-04-02 NOTE — Patient Instructions (Addendum)
Decrease to sertraline to 1/2 tab ( 3350m) daily for 2 weeks, then half a tab every other day for 8 days and then stop.   Call after 3 months if ready to wean the Wellbutrin

## 2014-04-19 ENCOUNTER — Other Ambulatory Visit: Payer: Self-pay | Admitting: Podiatry

## 2014-04-22 ENCOUNTER — Encounter: Payer: Self-pay | Admitting: Family Medicine

## 2014-04-22 ENCOUNTER — Other Ambulatory Visit: Payer: Self-pay | Admitting: Podiatry

## 2014-05-07 ENCOUNTER — Ambulatory Visit (INDEPENDENT_AMBULATORY_CARE_PROVIDER_SITE_OTHER): Payer: BC Managed Care – PPO | Admitting: Family Medicine

## 2014-05-07 ENCOUNTER — Other Ambulatory Visit: Payer: Self-pay | Admitting: Family Medicine

## 2014-05-07 ENCOUNTER — Encounter: Payer: Self-pay | Admitting: Family Medicine

## 2014-05-07 VITALS — BP 94/57 | HR 64 | Temp 98.0°F | Ht 71.0 in | Wt 162.0 lb

## 2014-05-07 DIAGNOSIS — J069 Acute upper respiratory infection, unspecified: Secondary | ICD-10-CM | POA: Diagnosis not present

## 2014-05-07 NOTE — Progress Notes (Signed)
   Subjective:    Patient ID: Melinda Reynolds, female    DOB: 08/15/1975, 38 y.o.   MRN: [id number]  HPI 5 days of fever, cough, chills, bodyaches, and sweats.  Cough with productive green sputum. Having some jaw pain.  Went to Urgent Care yesterday to be swabbed for the flu but it was neg.  Using IBU for fever and HA.  Initially temp was 102 over the weekend.  No fever today.       Review of Systems     Objective:   Physical Exam  Constitutional: She is oriented to person, place, and time. She appears well-developed and well-nourished.  HENT:  Head: Normocephalic and atraumatic.  Cardiovascular: Normal rate, regular rhythm and normal heart sounds.   Pulmonary/Chest: Effort normal and breath sounds normal.  Neurological: She is alert and oriented to person, place, and time.  Skin: Skin is warm and dry.  Psychiatric: She has a normal mood and affect. Her behavior is normal.          Assessment & Plan:  URI - gave reassurance. Most consistent with viral illness. Today's her first day without fever which is also reassuring that she has a cough. She can call by the end of the week if she feels like she's not continuing to improve or suddenly gets worse. Can use over-the-counter cough and cold medications. Be careful with handwashing etc. and being around others.

## 2014-05-08 LAB — COMPLETE METABOLIC PANEL WITH GFR
ALT: 15 U/L (ref 0–35)
AST: 23 U/L (ref 0–37)
Albumin: 4.3 g/dL (ref 3.5–5.2)
Alkaline Phosphatase: 60 U/L (ref 39–117)
BUN: 13 mg/dL (ref 6–23)
CO2: 26 meq/L (ref 19–32)
CREATININE: 0.92 mg/dL (ref 0.50–1.10)
Calcium: 9.8 mg/dL (ref 8.4–10.5)
Chloride: 102 mEq/L (ref 96–112)
GFR, Est Non African American: 80 mL/min
Glucose, Bld: 90 mg/dL (ref 70–99)
Potassium: 4.9 mEq/L (ref 3.5–5.3)
Sodium: 137 mEq/L (ref 135–145)
Total Bilirubin: 0.6 mg/dL (ref 0.2–1.2)
Total Protein: 6.9 g/dL (ref 6.0–8.3)

## 2014-05-08 LAB — CBC
HCT: 44.1 % (ref 36.0–46.0)
Hemoglobin: 14.9 g/dL (ref 12.0–15.0)
MCH: 30.3 pg (ref 26.0–34.0)
MCHC: 33.8 g/dL (ref 30.0–36.0)
MCV: 89.6 fL (ref 78.0–100.0)
Platelets: 287 10*3/uL (ref 150–400)
RBC: 4.92 MIL/uL (ref 3.87–5.11)
RDW: 12.4 % (ref 11.5–15.5)
WBC: 5.6 10*3/uL (ref 4.0–10.5)

## 2014-05-08 LAB — LIPID PANEL
CHOLESTEROL: 199 mg/dL (ref 0–200)
HDL: 85 mg/dL (ref >39)
LDL Cholesterol: 101 mg/dL — ABNORMAL HIGH (ref 0–99)
TRIGLYCERIDES: 63 mg/dL (ref <150)
Total CHOL/HDL Ratio: 2.3 Ratio
VLDL: 13 mg/dL (ref 0–40)

## 2014-05-08 LAB — FERRITIN: Ferritin: 72 ng/mL (ref 10–291)

## 2014-05-08 LAB — VITAMIN B12: Vitamin B-12: 375 pg/mL (ref 211–911)

## 2014-05-08 LAB — TSH: TSH: 4.122 u[IU]/mL (ref 0.350–4.500)

## 2014-05-09 ENCOUNTER — Telehealth: Payer: Self-pay | Admitting: *Deleted

## 2014-05-09 ENCOUNTER — Other Ambulatory Visit: Payer: Self-pay | Admitting: Family Medicine

## 2014-05-09 LAB — T3, FREE: T3, Free: 3 pg/mL (ref 2.3–4.2)

## 2014-05-09 LAB — T4, FREE: Free T4: 1.39 ng/dL (ref 0.80–1.80)

## 2014-05-09 MED ORDER — AMOXICILLIN-POT CLAVULANATE 875-125 MG PO TABS: 1.0000 | ORAL_TABLET | Freq: Two times a day (BID) | ORAL | Status: DC

## 2014-05-09 MED ORDER — HYDROCODONE-HOMATROPINE 5-1.5 MG/5ML PO SYRP: 5.0000 mL | ORAL_SOLUTION | Freq: Every evening | ORAL | Status: DC | PRN

## 2014-05-09 NOTE — Telephone Encounter (Signed)
Pt was seen on Tuesday and reports that she is not feeling any better and was told to call back. She is still has a cough and would like something for that and maybe an abx? Please advise.Loralee PacasBarkley, Breccan Galant CarmiLynetta

## 2014-05-09 NOTE — Telephone Encounter (Signed)
Ok for cough med.  Will have to pick up rx and use at bedtime.  Has she had any more fever?

## 2014-05-31 ENCOUNTER — Other Ambulatory Visit: Payer: Self-pay | Admitting: Obstetrics and Gynecology

## 2014-06-03 LAB — CYTOLOGY - PAP

## 2014-06-11 ENCOUNTER — Other Ambulatory Visit: Payer: Self-pay | Admitting: Family Medicine

## 2014-07-08 ENCOUNTER — Other Ambulatory Visit: Payer: Self-pay | Admitting: Family Medicine

## 2014-07-09 ENCOUNTER — Telehealth: Payer: Self-pay

## 2014-07-09 NOTE — Telephone Encounter (Signed)
Melinda Reynolds states she was unable to come off the Wellbutrin. She now needs a refill. Will she need to follow up now or wait until April?

## 2014-07-09 NOTE — Telephone Encounter (Signed)
OK to refill for 90 days supply

## 2014-07-11 MED ORDER — BUPROPION HCL ER (XL) 300 MG PO TB24: 300.0000 mg | ORAL_TABLET | Freq: Every day | ORAL | Status: DC

## 2014-07-11 NOTE — Telephone Encounter (Signed)
Melinda MuirJamie, Could you call patient and let her know the refill of the Wellbutrin has been sent to the pharmacy. She will need to follow up before more refills. Thank you.

## 2014-07-11 NOTE — Telephone Encounter (Signed)
Voice mail left for patient.

## 2014-07-29 ENCOUNTER — Encounter: Payer: Self-pay | Admitting: Family Medicine

## 2014-07-29 ENCOUNTER — Ambulatory Visit (INDEPENDENT_AMBULATORY_CARE_PROVIDER_SITE_OTHER): Payer: BLUE CROSS/BLUE SHIELD | Admitting: Family Medicine

## 2014-07-29 VITALS — BP 122/73 | HR 60 | Ht 71.0 in | Wt 168.0 lb

## 2014-07-29 DIAGNOSIS — F329 Major depressive disorder, single episode, unspecified: Secondary | ICD-10-CM

## 2014-07-29 DIAGNOSIS — F32A Depression, unspecified: Secondary | ICD-10-CM

## 2014-07-29 MED ORDER — SERTRALINE HCL 50 MG PO TABS: 50.0000 mg | ORAL_TABLET | Freq: Every day | ORAL | Status: DC

## 2014-07-29 NOTE — Progress Notes (Signed)
   Subjective:    Patient ID: Melinda Reynolds, female    DOB: 01/03/1976, 39 y.o.   MRN: [id number]  HPI  She tried to wean the zoloft and says got very irritable. She is sleeping well but some low energy.  She has been exercising.  Says restarted her medication aobut 6 weeks ago and is feeling come better.  She just feels raising 2 year ols twins has been very stressful.   Review of Systems     Objective:   Physical Exam  Constitutional: She is oriented to person, place, and time. She appears well-developed and well-nourished.  HENT:  Head: Normocephalic and atraumatic.  Cardiovascular: Normal rate, regular rhythm and normal heart sounds.   Pulmonary/Chest: Effort normal and breath sounds normal.  Neurological: She is alert and oriented to person, place, and time.  Skin: Skin is warm and dry.  Psychiatric: She has a normal mood and affect. Her behavior is normal.          Assessment & Plan:  Depression - under fair control. PHQ 9 score of 3 today and Gad 7 score of 4. She has restarted sertraline. Follow-up in 3 months her cough any palms or side effects or concerns. Continue Wellbutrin as well. Continue regular exercise.  Time spent 15 min, >50% spent counseling about depression

## 2014-09-10 ENCOUNTER — Other Ambulatory Visit: Payer: Self-pay | Admitting: Obstetrics and Gynecology

## 2014-11-04 ENCOUNTER — Encounter: Payer: Self-pay | Admitting: Sports Medicine

## 2014-11-04 ENCOUNTER — Ambulatory Visit (INDEPENDENT_AMBULATORY_CARE_PROVIDER_SITE_OTHER): Payer: BLUE CROSS/BLUE SHIELD | Admitting: Sports Medicine

## 2014-11-04 ENCOUNTER — Ambulatory Visit (INDEPENDENT_AMBULATORY_CARE_PROVIDER_SITE_OTHER): Payer: BLUE CROSS/BLUE SHIELD

## 2014-11-04 VITALS — BP 126/75 | HR 60 | Ht 71.0 in | Wt 155.0 lb

## 2014-11-04 DIAGNOSIS — M25532 Pain in left wrist: Secondary | ICD-10-CM | POA: Diagnosis not present

## 2014-11-04 DIAGNOSIS — M67439 Ganglion, unspecified wrist: Secondary | ICD-10-CM | POA: Insufficient documentation

## 2014-11-04 DIAGNOSIS — M67432 Ganglion, left wrist: Secondary | ICD-10-CM | POA: Diagnosis not present

## 2014-11-04 NOTE — Progress Notes (Signed)
   Subjective:    I'm seeing this patient as a consultation for:  Dr. Nani Gasseratherine Metheney  CC: Left wrist pain  HPI: For the past several days this pleasant 39 year old female has had swelling and pain over the dorsum of her left ulnar styloid process after working out in the gym, she did a day pretty heavy on wrists. Pain is moderate, persistent without radiation.  Past medical history, Surgical history, Family history not pertinant except as noted below, Social history, Allergies, and medications have been entered into the medical record, reviewed, and no changes needed.   Review of Systems: No headache, visual changes, nausea, vomiting, diarrhea, constipation, dizziness, abdominal pain, skin rash, fevers, chills, night sweats, weight loss, swollen lymph nodes, body aches, joint swelling, muscle aches, chest pain, shortness of breath, mood changes, visual or auditory hallucinations.   Objective:   General: Well Developed, well nourished, and in no acute distress.  Neuro/Psych: Alert and oriented x3, extra-ocular muscles intact, able to move all 4 extremities, sensation grossly intact. Skin: Warm and dry, no rashes noted.  Respiratory: Not using accessory muscles, speaking in full sentences, trachea midline.  Cardiovascular: Pulses palpable, no extremity edema. Abdomen: Does not appear distended. Left Wrist: Visible fusiform swelling over the ulnar styloid process dorsally, this area has fluctuance and is tender to palpation. ROM smooth and normal with good flexion and extension and ulnar/radial deviation that is symmetrical with opposite wrist. Palpation is normal over metacarpals, navicular, lunate, and TFCC; tendons without tenderness/ swelling No snuffbox tenderness. No tenderness over Canal of Guyon. Strength 5/5 in all directions without pain. Negative Finkelstein, tinel's and phalens. Negative Watson's test.  Procedure: Real-time Ultrasound Guided aspiration/Injection of left  dorsal wrist ganglion cyst Device: GE Logiq E  Verbal informed consent obtained.  Time-out conducted.  Noted no overlying erythema, induration, or other signs of local infection.  Skin prepped in a sterile fashion.  Local anesthesia: Topical Ethyl chloride.  With sterile technique and under real time ultrasound guidance:  Noted round hypoechoic structure in close approximation with the sixth extensor compartment, tender to palpation, 18-gauge needle advanced into the lesion, and a scant amount of thick fluid was aspirated, syringe switched and 0.5 mL kenalog 40, 0.5 mL lidocaine injected easily.  Completed without difficulty  Pain immediately resolved suggesting accurate placement of the medication.  Advised to call if fevers/chills, erythema, induration, drainage, or persistent bleeding.  Images permanently stored and available for review in the ultrasound unit.  Impression: Technically successful ultrasound guided injection.  X-rays are negative as expected. No fractures  Impression and Recommendations:   This case required medical decision making of moderate complexity.

## 2014-11-04 NOTE — Assessment & Plan Note (Signed)
Aspiration and injection as above. Occurred after wrist extension exercises in the gym. Velcro wrist brace. Discussed recurrence rate. X-rays.

## 2014-11-25 ENCOUNTER — Encounter: Payer: Self-pay | Admitting: Family Medicine

## 2014-11-25 ENCOUNTER — Ambulatory Visit (INDEPENDENT_AMBULATORY_CARE_PROVIDER_SITE_OTHER): Payer: BLUE CROSS/BLUE SHIELD | Admitting: Family Medicine

## 2014-11-25 VITALS — BP 123/80 | HR 58 | Ht 71.0 in | Wt 157.0 lb

## 2014-11-25 DIAGNOSIS — Z0000 Encounter for general adult medical examination without abnormal findings: Secondary | ICD-10-CM | POA: Diagnosis not present

## 2014-11-25 LAB — COMPLETE METABOLIC PANEL WITH GFR
ALBUMIN: 4.2 g/dL (ref 3.5–5.2)
ALT: 16 U/L (ref 0–35)
AST: 19 U/L (ref 0–37)
Alkaline Phosphatase: 58 U/L (ref 39–117)
BUN: 10 mg/dL (ref 6–23)
CHLORIDE: 102 meq/L (ref 96–112)
CO2: 26 meq/L (ref 19–32)
Calcium: 9.1 mg/dL (ref 8.4–10.5)
Creat: 0.82 mg/dL (ref 0.50–1.10)
GFR, Est Non African American: >89 mL/min
GLUCOSE: 89 mg/dL (ref 70–99)
Potassium: 4.5 mEq/L (ref 3.5–5.3)
Sodium: 138 mEq/L (ref 135–145)
TOTAL PROTEIN: 6.2 g/dL (ref 6.0–8.3)
Total Bilirubin: 0.7 mg/dL (ref 0.2–1.2)

## 2014-11-25 LAB — LIPID PANEL
Cholesterol: 183 mg/dL (ref 0–200)
HDL: 106 mg/dL (ref >=46)
LDL Cholesterol: 70 mg/dL (ref 0–99)
Total CHOL/HDL Ratio: 1.7 Ratio
Triglycerides: 36 mg/dL (ref <150)
VLDL: 7 mg/dL (ref 0–40)

## 2014-11-25 LAB — TSH: TSH: 4.181 u[IU]/mL (ref 0.350–4.500)

## 2014-11-25 MED ORDER — BUPROPION HCL ER (XL) 300 MG PO TB24: 300.0000 mg | ORAL_TABLET | Freq: Every day | ORAL | Status: DC

## 2014-11-25 NOTE — Progress Notes (Signed)
  Subjective:     Melinda Reynolds is a 39 y.o. female and is here for a comprehensive physical exam. The patient reports no problems.  History   Social History  . Marital Status: Married    Spouse Name: Gregary SignsSean    . Number of Children: 2  . Years of Education: N/A   Occupational History  . Stay at home mom.     Social History Main Topics  . Smoking status: Never Smoker   . Smokeless tobacco: Never Used  . Alcohol Use: 4.2 oz/week    7 Glasses of wine per week  . Drug Use: No  . Sexual Activity:    Partners: Male    Birth Control/ Protection: None     Comment: pregnant   Other Topics Concern  . Not on file   Social History Narrative   No regular exercise.     Health Maintenance  Topic Date Due  . INFLUENZA VACCINE  01/20/2015  . PAP SMEAR  05/31/2017  . TETANUS/TDAP  09/16/2021  . HIV Screening  Completed    The following portions of the patient's history were reviewed and updated as appropriate: allergies, current medications, past family history, past medical history, past social history, past surgical history and problem list.  Review of Systems A comprehensive review of systems was negative.   Objective:    There were no vitals taken for this visit. General appearance: alert, cooperative and appears stated age Head: Normocephalic, without obvious abnormality, atraumatic Eyes: conj clear, EOMe, PEERLA Ears: normal TM's and external ear canals both ears Nose: Nares normal. Septum midline. Mucosa normal. No drainage or sinus tenderness. Throat: lips, mucosa, and tongue normal; teeth and gums normal Neck: no adenopathy, no carotid bruit, no JVD, supple, symmetrical, trachea midline and thyroid not enlarged, symmetric, no tenderness/mass/nodules Back: symmetric, no curvature. ROM normal. No CVA tenderness. Lungs: clear to auscultation bilaterally Breasts: not examined Heart: regular rate and rhythm, S1, S2 normal, no murmur, click, rub or gallop Abdomen: soft,  non-tender; bowel sounds normal; no masses,  no organomegaly Pelvic: deferred and see gyn Extremities: extremities normal, atraumatic, no cyanosis or edema Pulses: 2+ and symmetric Skin: Skin color, texture, turgor normal. No rashes or lesions Lymph nodes: Cervical, supraclavicular, and axillary nodes normal. Neurologic: Alert and oriented X 3, normal strength and tone. Normal symmetric reflexes. Normal coordination and gait    Assessment:    Healthy female exam.       Plan:     See After Visit Summary for Counseling Recommendations   Keep up a regular exercise program and make sure you are eating a healthy diet Try to eat 4 servings of dairy a day, or if you are lactose intolerant take a calcium with vitamin D daily.  Your vaccines are up to date.  Needs form completed for work.

## 2014-11-25 NOTE — Patient Instructions (Signed)
complete physical examination Keep up a regular exercise program and make sure you are eating a healthy diet Try to eat 4 servings of dairy a day, or if you are lactose intolerant take a calcium with vitamin D daily.  Your vaccines are up to date.   

## 2014-11-26 NOTE — Progress Notes (Signed)
Quick Note:  All labs are normal. ______ 

## 2014-12-02 ENCOUNTER — Ambulatory Visit (INDEPENDENT_AMBULATORY_CARE_PROVIDER_SITE_OTHER): Payer: BLUE CROSS/BLUE SHIELD | Admitting: Sports Medicine

## 2014-12-02 ENCOUNTER — Encounter: Payer: Self-pay | Admitting: Sports Medicine

## 2014-12-02 VITALS — BP 120/71 | HR 58 | Ht 71.0 in | Wt 154.0 lb

## 2014-12-02 DIAGNOSIS — M67432 Ganglion, left wrist: Secondary | ICD-10-CM

## 2014-12-02 NOTE — Progress Notes (Signed)
  Subjective:    CC: Follow-up  HPI: This pleasant 39 year old female returns, we aspirated and injected a left sixth extensor compartment ganglion cyst at the last visit, it has significantly improved, unfortunately there still is mild presents of the cyst. She really doesn't have any pain, she has stopped wearing her wrist brace. Symptoms are mild, improving.  Past medical history, Surgical history, Family history not pertinant except as noted below, Social history, Allergies, and medications have been entered into the medical record, reviewed, and no changes needed.   Review of Systems: No fevers, chills, night sweats, weight loss, chest pain, or shortness of breath.   Objective:    General: Well Developed, well nourished, and in no acute distress.  Neuro: Alert and oriented x3, extra-ocular muscles intact, sensation grossly intact.  HEENT: Normocephalic, atraumatic, pupils equal round reactive to light, neck supple, no masses, no lymphadenopathy, thyroid nonpalpable.  Skin: Warm and dry, no rashes. Cardiac: Regular rate and rhythm, no murmurs rubs or gallops, no lower extremity edema.  Respiratory: Clear to auscultation bilaterally. Not using accessory muscles, speaking in full sentences. Left wrist: Still with a visible left sixth extensor compartment swelling just proximal to the ulnar styloid process. Nontender. Well-defined, movable.  Procedure: Real-time Ultrasound Guided Injection of left sixth extensor compartment ganglion cyst Device: GE Logiq E  Verbal informed consent obtained.  Time-out conducted.  Noted no overlying erythema, induration, or other signs of local infection.  Skin prepped in a sterile fashion.  Local anesthesia: Topical Ethyl chloride.  With sterile technique and under real time ultrasound guidance:  Using a 25-gauge needle and injected 0.25 mL lidocaine and 0.75 mL kenalog 40, following injection I fenestrated the cyst multiple times under ultrasound  guidance. Completed without difficulty  Pain immediately resolved suggesting accurate placement of the medication.  Advised to call if fevers/chills, erythema, induration, drainage, or persistent bleeding.  Images permanently stored and available for review in the ultrasound unit.  Impression: Technically successful ultrasound guided injection.  Impression and Recommendations:

## 2014-12-02 NOTE — Assessment & Plan Note (Signed)
Significant improvement after the previous aspiration and injection. This is in close approximation with the sixth extensor compartment. Today I performed a second injection, as well as a fenestration of the cyst capsule. He will wear the wrist brace when working out, and return in one month. We will likely do a single additional injection procedure if needed before considering surgical intervention

## 2015-01-06 ENCOUNTER — Ambulatory Visit: Payer: BLUE CROSS/BLUE SHIELD | Admitting: Sports Medicine

## 2015-01-17 ENCOUNTER — Other Ambulatory Visit: Payer: Self-pay | Admitting: Obstetrics and Gynecology

## 2015-02-07 ENCOUNTER — Ambulatory Visit (INDEPENDENT_AMBULATORY_CARE_PROVIDER_SITE_OTHER): Payer: BLUE CROSS/BLUE SHIELD | Admitting: Family Medicine

## 2015-02-07 ENCOUNTER — Encounter: Payer: Self-pay | Admitting: Family Medicine

## 2015-02-07 VITALS — BP 120/74 | HR 59 | Wt 160.0 lb

## 2015-02-07 DIAGNOSIS — L299 Pruritus, unspecified: Secondary | ICD-10-CM | POA: Diagnosis not present

## 2015-02-07 DIAGNOSIS — T148 Other injury of unspecified body region: Secondary | ICD-10-CM | POA: Diagnosis not present

## 2015-02-07 DIAGNOSIS — W57XXXA Bitten or stung by nonvenomous insect and other nonvenomous arthropods, initial encounter: Secondary | ICD-10-CM

## 2015-02-07 LAB — CBC WITH DIFFERENTIAL/PLATELET
Basophils Absolute: 0.1 10*3/uL (ref 0.0–0.1)
Basophils Relative: 1 % (ref 0–1)
EOS PCT: 2 % (ref 0–5)
Eosinophils Absolute: 0.1 10*3/uL (ref 0.0–0.7)
HEMATOCRIT: 40.3 % (ref 36.0–46.0)
HEMOGLOBIN: 13.8 g/dL (ref 12.0–15.0)
LYMPHS ABS: 1.7 10*3/uL (ref 0.7–4.0)
LYMPHS PCT: 27 % (ref 12–46)
MCH: 30.9 pg (ref 26.0–34.0)
MCHC: 34.2 g/dL (ref 30.0–36.0)
MCV: 90.4 fL (ref 78.0–100.0)
MONO ABS: 0.4 10*3/uL (ref 0.1–1.0)
MONOS PCT: 7 % (ref 3–12)
MPV: 10.2 fL (ref 8.6–12.4)
NEUTROS ABS: 3.9 10*3/uL (ref 1.7–7.7)
Neutrophils Relative %: 63 % (ref 43–77)
Platelets: 281 10*3/uL (ref 150–400)
RBC: 4.46 MIL/uL (ref 3.87–5.11)
RDW: 13 % (ref 11.5–15.5)
WBC: 6.2 10*3/uL (ref 4.0–10.5)

## 2015-02-07 NOTE — Patient Instructions (Signed)
Call back in 10 days if the symptoms haven't resolved.

## 2015-02-07 NOTE — Progress Notes (Signed)
   Subjective:    Patient ID: Melinda Reynolds, female    DOB: 1975-09-10, 39 y.o.   MRN: [id number]  HPI Patient comes in today complaining of itching on her hands and top and bottom of her feet. She said this actually started when she was driving back from [place] but at that time it was really over her whole body. She has tried topical cortisone as well as oral Benadryl. She denies any changing is in her diet. She did changed shampoos about 2 weeks ago. She did have one day where she had no itching at all which was about 6 days ago but then it came back the next day. She denies any actual rash. She is currently on 2 prescription medications which are not new. No swelling. No allergies or cold sxs.  Mild ST today.   Just completed 21 days of doxycycline for tick bite with rash. Tick bite was about 6-7 weeks ago.  Then caused a UTI and yeast infection. So treated with macrobid.  Still had sxs so went to ob and completed 7 days of macrobid.  Then had a 2nd diflucan for a yeast infection.   Review of Systems No GI sxs. No swelling in throat or difficulty swallowing.       Objective:   Physical Exam  Constitutional: She is oriented to person, place, and time. She appears well-developed and well-nourished.  HENT:  Head: Normocephalic and atraumatic.  Right Ear: External ear normal.  Left Ear: External ear normal.  Nose: Nose normal.  Mouth/Throat: Oropharynx is clear and moist.  TMs and canals are clear.   Eyes: Conjunctivae and EOM are normal. Pupils are equal, round, and reactive to light.  Neck: Neck supple. No thyromegaly present.  Cardiovascular: Normal rate, regular rhythm and normal heart sounds.   Pulmonary/Chest: Effort normal and breath sounds normal. She has no wheezes.  Abdominal: Soft. Bowel sounds are normal. She exhibits no distension and no mass. There is no tenderness. There is no rebound and no guarding.  Musculoskeletal: She exhibits no edema.  Lymphadenopathy:    She has no  cervical adenopathy.  Neurological: She is alert and oriented to person, place, and time.  Skin: Skin is warm and dry.  Psychiatric: She has a normal mood and affect. Her behavior is normal.          Assessment & Plan:  Pruritus-unclear etiology at this point. She has no actual rash on the skin exam is fairly benign. She has no other systemic symptoms which is reassuring and she does get relief with Benadryl. She could try switching to Zyrtec to get more extended relief as it's longer acting than Benadryl. We will check liver enzymes check thyroid and do a CBC as well. It also may be a combination of recent antibiotic so if her blood work comes back completely normal we will try to give this one more week to see if it resolves on its own. At that point it doesn't then we will move forward with allergy testing. I encouraged her to call us back if she experiences any new symptoms such as diarrhea or fever.  Tick bite - recheck lyme titer per patient request.

## 2015-02-08 LAB — COMPLETE METABOLIC PANEL WITH GFR
ALBUMIN: 4.4 g/dL (ref 3.6–5.1)
ALK PHOS: 57 U/L (ref 33–115)
ALT: 17 U/L (ref 6–29)
AST: 21 U/L (ref 10–30)
BUN: 15 mg/dL (ref 7–25)
CALCIUM: 9.5 mg/dL (ref 8.6–10.2)
CHLORIDE: 102 mmol/L (ref 98–110)
CO2: 26 mmol/L (ref 20–31)
Creat: 0.79 mg/dL (ref 0.50–1.10)
Glucose, Bld: 95 mg/dL (ref 65–99)
POTASSIUM: 5.7 mmol/L — AB (ref 3.5–5.3)
Sodium: 139 mmol/L (ref 135–146)
Total Bilirubin: 0.6 mg/dL (ref 0.2–1.2)
Total Protein: 6.2 g/dL (ref 6.1–8.1)

## 2015-02-08 LAB — TSH: TSH: 2.493 u[IU]/mL (ref 0.350–4.500)

## 2015-02-10 LAB — B. BURGDORFI ANTIBODIES: B BURGDORFERI AB IGG+ IGM: 0.19 {ISR}

## 2015-04-21 ENCOUNTER — Other Ambulatory Visit: Payer: Self-pay | Admitting: Family Medicine

## 2015-06-20 ENCOUNTER — Other Ambulatory Visit: Payer: Self-pay | Admitting: Obstetrics and Gynecology

## 2015-06-20 DIAGNOSIS — N6313 Unspecified lump in the right breast, lower outer quadrant: Secondary | ICD-10-CM

## 2015-06-20 DIAGNOSIS — N6323 Unspecified lump in the left breast, lower outer quadrant: Secondary | ICD-10-CM

## 2015-06-20 LAB — HM PAP SMEAR

## 2015-06-24 ENCOUNTER — Ambulatory Visit
Admission: RE | Admit: 2015-06-24 | Discharge: 2015-06-24 | Disposition: A | Discharge status: Home / Self Care | Payer: No Typology Code available for payment source | Source: Ambulatory Visit | Attending: Obstetrics and Gynecology | Admitting: Obstetrics and Gynecology

## 2015-06-24 ENCOUNTER — Ambulatory Visit
Admission: RE | Admit: 2015-06-24 | Discharge: 2015-06-24 | Disposition: A | Discharge status: Home / Self Care | Payer: Managed Care, Other (non HMO) | Source: Ambulatory Visit | Attending: Obstetrics and Gynecology | Admitting: Obstetrics and Gynecology

## 2015-06-24 ENCOUNTER — Other Ambulatory Visit: Payer: Self-pay | Admitting: Family Medicine

## 2015-06-24 DIAGNOSIS — N6323 Unspecified lump in the left breast, lower outer quadrant: Secondary | ICD-10-CM

## 2015-06-24 DIAGNOSIS — N6313 Unspecified lump in the right breast, lower outer quadrant: Secondary | ICD-10-CM

## 2015-06-24 LAB — CYTOLOGY - PAP

## 2015-06-24 MED ORDER — BUPROPION HCL ER (XL) 300 MG PO TB24: 300.0000 mg | ORAL_TABLET | Freq: Every day | ORAL | Status: DC

## 2015-07-07 ENCOUNTER — Ambulatory Visit: Payer: No Typology Code available for payment source | Admitting: Family Medicine

## 2015-07-09 ENCOUNTER — Encounter: Payer: Self-pay | Admitting: Family Medicine

## 2015-07-09 ENCOUNTER — Ambulatory Visit (INDEPENDENT_AMBULATORY_CARE_PROVIDER_SITE_OTHER): Payer: Managed Care, Other (non HMO) | Admitting: Family Medicine

## 2015-07-09 VITALS — BP 114/72 | HR 72 | Wt 160.0 lb

## 2015-07-09 DIAGNOSIS — F329 Major depressive disorder, single episode, unspecified: Secondary | ICD-10-CM | POA: Diagnosis not present

## 2015-07-09 DIAGNOSIS — F32A Depression, unspecified: Secondary | ICD-10-CM

## 2015-07-09 MED ORDER — SERTRALINE HCL 50 MG PO TABS: 50.0000 mg | ORAL_TABLET | Freq: Every day | ORAL | Status: DC

## 2015-07-09 MED ORDER — BUPROPION HCL ER (XL) 300 MG PO TB24: 300.0000 mg | ORAL_TABLET | Freq: Every day | ORAL | Status: DC

## 2015-07-09 NOTE — Progress Notes (Signed)
   Subjective:    Patient ID: Melinda Reynolds, female    DOB: [birth year], 40 y.o.   MRN: [id number]  HPI Depression, acute - she is doing well overall. 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 She has been taking them to preschool 3 days per week for 4 hours each time. That has allowed her to get to the gym and start working out regularly over the last month or so. She is feeling much better. She is happy with her medication regimen and does not want to make any changes or adjustments. She says she will likely stay on it until they get ready to go to kindergarten. She denies feeling down or depressed. She does report feeling nervous and anxious several days of the week and still some irritability more than half the days. She rates her symptoms as not difficult at all. She is sleeping well.    Review of Systems     Objective:   Physical Exam  Constitutional: She is oriented to person, place, and time. She appears well-developed and well-nourished.  HENT:  Head: Normocephalic and atraumatic.  Cardiovascular: Normal rate, regular rhythm and normal heart sounds.   Pulmonary/Chest: Effort normal and breath sounds normal.  Neurological: She is alert and oriented to person, place, and time.  Skin: Skin is warm and dry.  Psychiatric: She has a normal mood and affect. Her behavior is normal.          Assessment & Plan:  Depression, acute - well controlled. PHQ 9 score of 0 and 7 Score of 3. Continue Current Regimen. Follow-Up in 6 Months.

## 2015-07-15 ENCOUNTER — Encounter: Payer: Self-pay | Admitting: Family Medicine

## 2016-01-06 ENCOUNTER — Encounter: Payer: No Typology Code available for payment source | Admitting: Family Medicine

## 2016-01-13 ENCOUNTER — Other Ambulatory Visit: Payer: Self-pay | Admitting: *Deleted

## 2016-01-13 ENCOUNTER — Ambulatory Visit: Payer: No Typology Code available for payment source | Admitting: Family Medicine

## 2016-01-13 MED ORDER — SERTRALINE HCL 50 MG PO TABS: 50.0000 mg | ORAL_TABLET | Freq: Every day | ORAL | 1 refills | Status: DC

## 2016-01-13 MED ORDER — BUPROPION HCL ER (XL) 300 MG PO TB24: 300.0000 mg | ORAL_TABLET | Freq: Every day | ORAL | 1 refills | Status: DC

## 2016-01-26 ENCOUNTER — Ambulatory Visit (INDEPENDENT_AMBULATORY_CARE_PROVIDER_SITE_OTHER): Payer: Managed Care, Other (non HMO) | Admitting: Family Medicine

## 2016-01-26 ENCOUNTER — Ambulatory Visit (INDEPENDENT_AMBULATORY_CARE_PROVIDER_SITE_OTHER): Payer: Managed Care, Other (non HMO) | Admitting: Podiatry

## 2016-01-26 ENCOUNTER — Encounter: Payer: Self-pay | Admitting: Family Medicine

## 2016-01-26 VITALS — BP 129/71 | HR 74 | Wt 160.0 lb

## 2016-01-26 DIAGNOSIS — F32A Depression, unspecified: Secondary | ICD-10-CM

## 2016-01-26 DIAGNOSIS — M216X9 Other acquired deformities of unspecified foot: Secondary | ICD-10-CM | POA: Diagnosis not present

## 2016-01-26 DIAGNOSIS — F329 Major depressive disorder, single episode, unspecified: Secondary | ICD-10-CM

## 2016-01-26 DIAGNOSIS — M7752 Other enthesopathy of left foot: Secondary | ICD-10-CM

## 2016-01-26 DIAGNOSIS — M6588 Other synovitis and tenosynovitis, other site: Secondary | ICD-10-CM

## 2016-01-26 DIAGNOSIS — M21969 Unspecified acquired deformity of unspecified lower leg: Secondary | ICD-10-CM | POA: Diagnosis not present

## 2016-01-26 MED ORDER — BUPROPION HCL ER (XL) 300 MG PO TB24: 300.0000 mg | ORAL_TABLET | Freq: Every day | ORAL | 3 refills | Status: DC

## 2016-01-26 MED ORDER — SERTRALINE HCL 50 MG PO TABS: 50.0000 mg | ORAL_TABLET | Freq: Every day | ORAL | 3 refills | Status: DC

## 2016-01-26 NOTE — Patient Instructions (Signed)
Seen for pain in left lateral ankle. Noted of inflamed Peroneal tendon sheet from compensating rearfoot for excess sagittal plane motion of the first ray.  May benefit from Custom orthotics. Will call with insurance info.

## 2016-01-26 NOTE — Progress Notes (Signed)
Subjective:    CC: Depression  HPI:  Here for follow-up for depression. She was last seen in January of this year.  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 They are now 39 years old. They will start school next year. She has been taking them to preschool 3 days per week for 4 hours each time. That has allowed her to get to the gym and start working out regularly over the last month or so. She is feeling much better. She is happy with her medication regimen and does not want to make any changes or adjustments. She says she will likely stay on it until they get ready to go to kindergarten. She is sleeping well. She does complain of some occasional fatigue and mild irritability but otherwise denies feeling depressed or overly anxious.  Past medical history, Surgical history, Family history not pertinant except as noted below, Social history, Allergies, and medications have been entered into the medical record, reviewed, and corrections made.   Review of Systems: No fevers, chills, night sweats, weight loss, chest pain, or shortness of breath.   Objective:    General: Well Developed, well nourished, and in no acute distress.  Neuro: Alert and oriented x3, extra-ocular muscles intact, sensation grossly intact.  HEENT: Normocephalic, atraumatic  Skin: Warm and dry, no rashes. Cardiac: Regular rate and rhythm, no murmurs rubs or gallops, no lower extremity edema.  Respiratory: Clear to auscultation bilaterally. Not using accessory muscles, speaking in full sentences.   Impression and Recommendations:   Depression-PHQ 9 score of 2.  GAD 7 score of 2.   Well controlled. Continue current regimen. Follow up in 12 mo.

## 2016-01-26 NOTE — Progress Notes (Signed)
   SUBJECTIVE: 40 y.o. year old female presents stating that she started on walking exercise 3-4 weeks ago. Then about 2 weeks ago (01/12/16) the ankle became swollen with pain in the morning when she woke up and could barely walk.  She iced and rested for the past 2 weeks. Now the foot is not swollen and pain is not consistent, but pain and swelling comes sporadically under left lateral ankle and heel area.  Patient feels tenderness when pressed along the course of Peroneus tendon.  Pain is usually at lateral aspect of the left lateral heel and sometimes the pain goes under the heel.  She walks 2-3 miles/day, 4 days a week.    REVIEW OF SYSTEMS: A comprehensive review of systems was negative.  OBJECTIVE: DERMATOLOGIC EXAMINATION: No abnormal skin lesions noted.  VASCULAR EXAMINATION OF LOWER LIMBS: Pedal pulses: All pedal pulses are palpable with normal pulsation.  Temperature gradient from tibial crest to dorsum of foot is within normal bilateral.  NEUROLOGIC EXAMINATION OF THE LOWER LIMBS: All epicritic and tactile sensations grossly intact.   MUSCULOSKELETAL EXAMINATION: Positive for excess sagittal plane motion of the first ray bilateral.   ASSESSMENT: Hypermobile first ray bilateral. Tenosynovitis peroneal tendon posterolateral ankle left.  PLAN: Reviewed clinical findings and abnormal biomechanics of foot. Reviewed benefit of stabilizing ankle joint for the next few weeks while on feet long hours. May benefit from custom orthotics.

## 2016-01-27 ENCOUNTER — Encounter: Payer: Self-pay | Admitting: *Deleted

## 2016-03-09 ENCOUNTER — Ambulatory Visit (INDEPENDENT_AMBULATORY_CARE_PROVIDER_SITE_OTHER): Payer: Managed Care, Other (non HMO) | Admitting: Family Medicine

## 2016-03-09 ENCOUNTER — Encounter: Payer: Self-pay | Admitting: Family Medicine

## 2016-03-09 ENCOUNTER — Ambulatory Visit (INDEPENDENT_AMBULATORY_CARE_PROVIDER_SITE_OTHER): Payer: Managed Care, Other (non HMO)

## 2016-03-09 VITALS — BP 111/68 | HR 76 | Ht 71.0 in | Wt 164.0 lb

## 2016-03-09 DIAGNOSIS — Z23 Encounter for immunization: Secondary | ICD-10-CM

## 2016-03-09 DIAGNOSIS — Z3009 Encounter for other general counseling and advice on contraception: Secondary | ICD-10-CM

## 2016-03-09 DIAGNOSIS — M5090 Cervical disc disorder, unspecified, unspecified cervical region: Secondary | ICD-10-CM

## 2016-03-09 DIAGNOSIS — M50322 Other cervical disc degeneration at C5-C6 level: Secondary | ICD-10-CM | POA: Diagnosis not present

## 2016-03-09 DIAGNOSIS — L299 Pruritus, unspecified: Secondary | ICD-10-CM | POA: Diagnosis not present

## 2016-03-09 DIAGNOSIS — Z309 Encounter for contraceptive management, unspecified: Secondary | ICD-10-CM | POA: Diagnosis not present

## 2016-03-09 LAB — COMPLETE METABOLIC PANEL WITH GFR
ALK PHOS: 51 U/L (ref 33–115)
ALT: 14 U/L (ref 6–29)
AST: 22 U/L (ref 10–30)
Albumin: 4.6 g/dL (ref 3.6–5.1)
BUN: 13 mg/dL (ref 7–25)
CHLORIDE: 101 mmol/L (ref 98–110)
CO2: 26 mmol/L (ref 20–31)
Calcium: 9.4 mg/dL (ref 8.6–10.2)
Creat: 1 mg/dL (ref 0.50–1.10)
GFR, EST NON AFRICAN AMERICAN: 71 mL/min (ref >=60)
GFR, Est African American: 82 mL/min (ref >=60)
GLUCOSE: 96 mg/dL (ref 65–99)
POTASSIUM: 5 mmol/L (ref 3.5–5.3)
SODIUM: 137 mmol/L (ref 135–146)
Total Bilirubin: 0.8 mg/dL (ref 0.2–1.2)
Total Protein: 6.6 g/dL (ref 6.1–8.1)

## 2016-03-09 LAB — CBC WITH DIFFERENTIAL/PLATELET
BASOS ABS: 51 {cells}/uL (ref 0–200)
Basophils Relative: 1 %
EOS PCT: 2 %
Eosinophils Absolute: 102 cells/uL (ref 15–500)
HCT: 41.6 % (ref 35.0–45.0)
Hemoglobin: 14.2 g/dL (ref 11.7–15.5)
Lymphocytes Relative: 27 %
Lymphs Abs: 1377 cells/uL (ref 850–3900)
MCH: 30.9 pg (ref 27.0–33.0)
MCHC: 34.1 g/dL (ref 32.0–36.0)
MCV: 90.4 fL (ref 80.0–100.0)
MONOS PCT: 6 %
MPV: 10.3 fL (ref 7.5–12.5)
Monocytes Absolute: 306 cells/uL (ref 200–950)
NEUTROS ABS: 3264 {cells}/uL (ref 1500–7800)
NEUTROS PCT: 64 %
PLATELETS: 295 10*3/uL (ref 140–400)
RBC: 4.6 MIL/uL (ref 3.80–5.10)
RDW: 12.5 % (ref 11.0–15.0)
WBC: 5.1 10*3/uL (ref 3.8–10.8)

## 2016-03-09 MED ORDER — CAPSAICIN 0.025 % EX GEL: 1.0000 "application " | Freq: Two times a day (BID) | CUTANEOUS | 99 refills | Status: DC | PRN

## 2016-03-09 MED ORDER — NORETHIN ACE-ETH ESTRAD-FE 1-20 MG-MCG PO TABS: 1.0000 | ORAL_TABLET | Freq: Every day | ORAL | 11 refills | Status: DC

## 2016-03-09 NOTE — Addendum Note (Signed)
Addended by: Deno EtienneBARKLEY, Rueben Kassim L on: 03/09/2016 04:30 PM   Modules accepted: Orders

## 2016-03-09 NOTE — Patient Instructions (Signed)
  Brachioradial pruritus-Although there is some evidence that topical therapies can be useful for the treatment of brachioradial pruritus, further study is necessary to clarify efficacy. Topical capsaicin 0.025% cream was associated with symptom improvement in 13 out of 15 patients in an open-label study [15] and in four out of seven patients in a small case series [16]. In contrast, a randomized trial of 13 patients with methodological flaws showed no benefit of capsaicin 0.025% cream over a placebo cream [117]. A small open-label study of five patients suggested sustained benefit following a single application of a capsaicin 8% patch [118]. Topical 1% menthol may offer some relief [119]. Topical corticosteroids are not effective [117,120].  In addition to the commercially available topical therapies mentioned above, use of compounded creams containing amitriptyline and ketamine with or without lidocaine have been associated with complete or significant improvement in brachioradial pruritus [34,121]. Additional studies are necessary to explore the efficacy and safety of topical amitriptyline-ketamine. Systemic administration of ketamine has been associated with multiple side effects. (See "Ketamine poisoning".) First-line options for oral therapy in patients who do not respond to local therapies include gabapentin [122-125] and pregabalin [126]. Improvement with ketoprofen [127] or lamotrigine [128] has also been documented in case reports. Surgical intervention is an option for patients with severe, intractable symptoms who fail to improve with other therapies. Cervical spine manipulation helped 10 out of 14 patients in one retrospective study [129]. Patients who have undergone surgical intervention for treatment of cervical tumor or disc herniation also have reported relief of pruritus [119,130].

## 2016-03-09 NOTE — Progress Notes (Signed)
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  Given a cream to use on the Itchy areas. It only affects her forearms and outer upper arms. Does not affect any other location on her body. She says it can become so intense that it actually wakes her up at night. She says the only thing that really helps relieve her symptoms is putting ice packs on it. She did try the topical steroid that the dermatologist gave her and said it really did not help. She says it did seem to be better over the winter months and got worse over the summer but she says she remembered the dermatologist telling her that heat and sunlight exposure can worsen or exacerbate the symptoms. She has not tried [employer] as well. It does provide some relief but doesn't really like the odor of it.  Also wants to discuss getting back on birth control.  She wants to restart to control her hormones. Her husband has a vasectomy.   BP 111/68   Pulse 76   Ht 5\' 11"  (1.803 m)   Wt 164 lb (74.4 kg)   LMP 03/05/2016 (Exact Date)   SpO2 97%   BMI 22.87 kg/m     No Known Allergies  Past Medical History:  Diagnosis Date  . No pertinent past medical history     Past Surgical History:  Procedure Laterality Date  . BREAST BIOPSY     mole removed from breast  . CESAREAN SECTION  09/16/2011   Procedure: CESAREAN SECTION;  Surgeon: Loney Laurence, MD;  Location: [facility];  Service: Gynecology;  Laterality: N/A;  Twins  . TONSILLECTOMY    . WISDOM TOOTH EXTRACTION      Social History   Social History  . Marital status: Married    Spouse name: Gregary Signs    . Number of children: 2  . Years of education: N/A   Occupational History  . Stay at home mom.     Social History Main Topics  . Smoking status: Never Smoker  . Smokeless tobacco: Never Used  . Alcohol use 4.2 - 6.0 oz/week    7 - 10 Glasses of wine per week  . Drug use: No  . Sexual activity: Yes     Partners: Male    Birth control/ protection: None   Other Topics Concern  . Not on file   Social History Narrative   Some regular exercise.      Family History  Problem Relation Age of Onset  . Heart attack Father   . Hyperlipidemia Father   . Lung cancer Father     small cell  . Diabetes Maternal Grandfather     Outpatient Encounter Prescriptions as of 03/09/2016  Medication Sig  . buPROPion (WELLBUTRIN XL) 300 MG 24 hr tablet Take 1 tablet (300 mg total) by mouth daily.  . sertraline (ZOLOFT) 50 MG tablet Take 1 tablet (50 mg total) by mouth daily.  . Capsaicin (CAPSAGEL) 0.025 % GEL Apply 1 application topically 2 (two) times daily as needed.  . norethindrone-ethinyl estradiol (JUNEL FE,GILDESS FE,LOESTRIN FE) 1-20 MG-MCG tablet Take 1 tablet by mouth daily.   No facility-administered encounter medications on file as of 03/09/2016.         Review of Systems: No fevers, chills, night sweats, weight loss, chest pain, or shortness of breath.   Objective:    General: Well Developed, well  nourished, and in no acute distress.  Neuro: Alert and oriented x3, extra-ocular muscles intact, sensation grossly intact.  HEENT: Normocephalic, atraumatic  Skin: Warm and dry, no rashes. Cardiac: Regular rate and rhythm, no murmurs rubs or gallops, no lower extremity edema.  Respiratory: Clear to auscultation bilaterally. Not using accessory muscles, speaking in full sentences. MSK: Cervical spinal normal flexion and extension rotation right and left and side bending. Shoulders with normal range of motion as well. No rash on the forearms.   Impression and Recommendations:   Brachioradial pruritus-discussed treatment options and given additional information.  We will evaluate for cervical spine issues including degenerative disc or herniated disc. We'll start with an x-ray. We will do some additional blood work is to see if her eosinophils are elevated which might be a sign that she is  allergic to something also check liver enzymes. I suspect that the labs will be normal though. Will start with capsaicin gel. If she doesn't tolerate this well and consider a compounded medication that includes amitriptyline and possibly ketamine with lidocaine.. from up-to-date. We'll start with Capsacin cream. Also evaluate for possible cervical disorder and start with x-ray. Consider MRI in the future.  Contraceptive counseling - she wants to restart OCP.  New rx sent.

## 2016-03-10 NOTE — Progress Notes (Signed)
All labs are normal. 

## 2016-03-22 ENCOUNTER — Ambulatory Visit: Payer: No Typology Code available for payment source | Admitting: Physical Therapy

## 2016-04-08 ENCOUNTER — Telehealth: Payer: Self-pay

## 2016-04-08 MED ORDER — AMBULATORY NON FORMULARY MEDICATION: 3 refills | Status: DC

## 2016-04-08 NOTE — Telephone Encounter (Signed)
Rx printed. Can fax to [facility] as they do compounding.

## 2016-04-08 NOTE — Telephone Encounter (Signed)
Melinda SetaHeather spoke with some people about her itching. They recommended a compound medication amitriptyline hydrochloride 10 %, ketamine hydrochloride 0.5 % and Vanicream. She would like a prescription sent to a compounding pharmacy. She didn't give a ratio. Please advise.

## 2016-04-08 NOTE — Telephone Encounter (Signed)
Faxed to pharmacy, patient is aware.

## 2016-05-04 ENCOUNTER — Telehealth: Payer: Self-pay

## 2016-05-04 MED ORDER — AMBULATORY NON FORMULARY MEDICATION: 3 refills | Status: DC

## 2016-05-04 NOTE — Telephone Encounter (Signed)
Melinda Reynolds called and states the compound is not helping. She would like a different compound with Ketamine 5 % Gabapentin 6 % Amitriptyline 5 % Lidocaine 5 % Nifedipine 2 % Baclofen 5 %. Please advise.

## 2016-05-04 NOTE — Telephone Encounter (Signed)
OK to re-write Rx as below. Thank you.  Do as non-amb.

## 2016-05-04 NOTE — Telephone Encounter (Signed)
Patient advised and medication sent.  

## 2016-10-19 ENCOUNTER — Other Ambulatory Visit: Payer: Self-pay | Admitting: Obstetrics and Gynecology

## 2016-10-20 LAB — CYTOLOGY - PAP

## 2017-02-23 DIAGNOSIS — M5412 Radiculopathy, cervical region: Secondary | ICD-10-CM | POA: Diagnosis not present

## 2017-02-27 DIAGNOSIS — M542 Cervicalgia: Secondary | ICD-10-CM | POA: Diagnosis not present

## 2017-02-28 ENCOUNTER — Other Ambulatory Visit: Payer: Self-pay | Admitting: Obstetrics and Gynecology

## 2017-02-28 DIAGNOSIS — Z1231 Encounter for screening mammogram for malignant neoplasm of breast: Secondary | ICD-10-CM

## 2017-03-01 ENCOUNTER — Ambulatory Visit
Admission: RE | Admit: 2017-03-01 | Discharge: 2017-03-01 | Disposition: A | Discharge status: Home / Self Care | Payer: BLUE CROSS/BLUE SHIELD | Source: Ambulatory Visit | Attending: Obstetrics and Gynecology | Admitting: Obstetrics and Gynecology

## 2017-03-01 DIAGNOSIS — Z1231 Encounter for screening mammogram for malignant neoplasm of breast: Secondary | ICD-10-CM

## 2017-03-01 DIAGNOSIS — M5412 Radiculopathy, cervical region: Secondary | ICD-10-CM | POA: Diagnosis not present

## 2017-03-03 ENCOUNTER — Other Ambulatory Visit: Payer: Self-pay | Admitting: Obstetrics and Gynecology

## 2017-03-03 DIAGNOSIS — R928 Other abnormal and inconclusive findings on diagnostic imaging of breast: Secondary | ICD-10-CM

## 2017-03-09 ENCOUNTER — Ambulatory Visit
Admission: RE | Admit: 2017-03-09 | Discharge: 2017-03-09 | Disposition: A | Discharge status: Home / Self Care | Payer: BLUE CROSS/BLUE SHIELD | Source: Ambulatory Visit | Attending: Obstetrics and Gynecology | Admitting: Obstetrics and Gynecology

## 2017-03-09 DIAGNOSIS — R928 Other abnormal and inconclusive findings on diagnostic imaging of breast: Secondary | ICD-10-CM

## 2017-03-09 DIAGNOSIS — R922 Inconclusive mammogram: Secondary | ICD-10-CM | POA: Diagnosis not present

## 2017-03-09 DIAGNOSIS — R921 Mammographic calcification found on diagnostic imaging of breast: Secondary | ICD-10-CM | POA: Diagnosis not present

## 2017-03-09 DIAGNOSIS — N6002 Solitary cyst of left breast: Secondary | ICD-10-CM | POA: Diagnosis not present

## 2017-03-15 DIAGNOSIS — M5412 Radiculopathy, cervical region: Secondary | ICD-10-CM | POA: Diagnosis not present

## 2017-03-15 DIAGNOSIS — M4802 Spinal stenosis, cervical region: Secondary | ICD-10-CM | POA: Diagnosis not present

## 2017-03-15 DIAGNOSIS — R203 Hyperesthesia: Secondary | ICD-10-CM | POA: Diagnosis not present

## 2017-03-16 ENCOUNTER — Encounter: Payer: Self-pay | Admitting: Family Medicine

## 2017-03-16 ENCOUNTER — Ambulatory Visit (INDEPENDENT_AMBULATORY_CARE_PROVIDER_SITE_OTHER): Payer: BLUE CROSS/BLUE SHIELD | Admitting: Family Medicine

## 2017-03-16 VITALS — BP 129/67 | HR 57 | Wt 171.0 lb

## 2017-03-16 DIAGNOSIS — R29818 Other symptoms and signs involving the nervous system: Secondary | ICD-10-CM | POA: Insufficient documentation

## 2017-03-16 DIAGNOSIS — Z23 Encounter for immunization: Secondary | ICD-10-CM

## 2017-03-16 NOTE — Progress Notes (Signed)
Subjective:    I'm seeing this patient as a consultation for:  Agapito Games, MD   CC: Paresthesias.   HPI: Patient has bilateral upper extremity paresthesias. This is been ongoing now for years. They can be quite problematic and obnoxious. Interfere with sleep. She denies any weakness or numbness however. She's been seen by several doctors including a physical medicine and rehabilitation doctor who has obtained a recent cervical MRI concern for C5 cervical radicular symptoms. The cervical MRI reportedly unremarkable for impingement at this area. The patient notes that she has a family history for multiple sclerosis. Her sister has MS and has similar symptoms. She is worried that she could have MS as well. She is reluctant to take gabapentin because it makes her feel tired.  Past medical history, Surgical history, Family history not pertinant except as noted below, Social history, Allergies, and medications have been entered into the medical record, reviewed, and no changes needed.   Review of Systems: No headache, visual changes, nausea, vomiting, diarrhea, constipation, dizziness, abdominal pain, skin rash, fevers, chills, night sweats, weight loss, swollen lymph nodes, body aches, joint swelling, muscle aches, chest pain, shortness of breath, mood changes, visual or auditory hallucinations.   Objective:    Vitals:   03/16/17 0837  BP: 129/67  Pulse: (!) 57   General: Well Developed, well nourished, and in no acute distress.  Neuro/Psych: Alert and oriented x3, extra-ocular muscles intact, able to move all 4 extremities, sensation grossly intact. Skin: Warm and dry, no rashes noted.  Respiratory: Not using accessory muscles, speaking in full sentences, trachea midline.  Cardiovascular: Pulses palpable, no extremity edema. Abdomen: Does not appear distended. MSK:  C-spine nontender to spinal midline normal neck motion negative Spurling's test. Upper extremity strength  sensation and reflexes are equal and normal throughout.  Study Result   CLINICAL DATA:  Neck pain with bilateral radiculopathy for 8 months. No known injury.  EXAM: CERVICAL SPINE - COMPLETE 4+ VIEW  COMPARISON:  None.  FINDINGS: Loss of normal cervical lordosis. Disc space narrowing at C4-5 and C5-6. No fracture or subluxation. No neural foraminal narrowing bilaterally. Prevertebral soft tissues are normal.  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 No acute findings.   Electronically Signed   By: Charlett Nose M.D.   On: 03/09/2016 11:48      No results found for this or any previous visit (from the past 24 hour(s)). No results found.  Impression and Recommendations:    Assessment and Plan: 41 y.o. female with Bilateral upper extremity paresthesias with reportedly unremarkable cervical MRI. We are waiting on the report from the orthopedic office where this was done. I am concerned for multiple sclerosis based on her family history. We discussed options. Plan for brain MRI to evaluate for MS.  Influenza vaccine given today.   Orders Placed This Encounter  Procedures  . MR Brain Wo Contrast    Standing Status:   Future    Standing Expiration Date:   05/16/2018    Scheduling Instructions:     MS protocol. Evaluate possibiloty of MS.    Order Specific Question:   What is the patient's sedation requirement?    Answer:   No Sedation    Order Specific Question:   Does the patient have a pacemaker or implanted devices?    Answer:   No    Order Specific Question:   Preferred imaging location?    Answer:  [facility] (table limit-350lbs)    Order Specific Question:   Radiology Contrast Protocol - do NOT remove file path    Answer:   [email or web address]  . Flu Vaccine QUAD 36+ mos IM   No orders of the defined types were placed in this encounter.   Discussed  warning signs or symptoms. Please see discharge instructions. Patient expresses understanding.

## 2017-03-16 NOTE — Patient Instructions (Signed)
Thank you for coming in today. You should hear about the brain MRI soon.  MRIs are typically done on Mondays.  We will get results to you ASAP.   Multiple Sclerosis Multiple sclerosis (MS) is a disease of the central nervous system. It leads to the loss of the insulating covering of the nerves (myelin sheath) of your brain. When this happens, brain signals do not get sent properly or may not get sent at all. The age of onset of MS varies. What are the causes? The cause of MS is unknown. However, it is more common in the [place] than in the [place]. What increases the risk? There is a higher number of women with MS than men. MS is not an illness that is passed down to you from your family members (inherited). However, your risk of MS is higher if you have a relative with MS. What are the signs or symptoms? The symptoms of MS occur in episodes or attacks. These attacks may last weeks to months. There may be long periods of almost no symptoms between attacks. The symptoms of MS vary. This is because of the many different ways it affects the central nervous system. The main symptoms of MS include:  Vision problems and eye pain.  Numbness.  Weakness.  Inability to move your arms, hands, feet, or legs (paralysis).  Balance problems.  Tremors.  How is this diagnosed? Your health care provider can diagnose MS with the help of imaging exams and lab tests. These may include specialized X-ray exams and spinal fluid tests. The best imaging exam to confirm a diagnosis of MS is an MRI. How is this treated? There is no known cure for MS, but there are medicines that can decrease the number and frequency of attacks. Steroids are often used for short-term relief. Physical and occupational therapy may also help. There are also many new alternative or complementary treatments available to help control the symptoms of MS. Ask your health care provider if any of these other  options are right for you. Follow these instructions at home:  Take medicines as directed by your health care provider.  Exercise as directed by your health care provider. Contact a health care provider if: You begin to feel depressed. Get help right away if:  You develop paralysis.  You have problems with bladder, bowel, or sexual function.  You develop mental changes, such as forgetfulness or mood swings.  You have a period of uncontrolled movements (seizure). This information is not intended to replace advice given to you by your health care provider. Make sure you discuss any questions you have with your health care provider. Document Released: 06/04/2000 Document Revised: 11/13/2015 Document Reviewed: 02/12/2013 Elsevier Interactive Patient Education  2017 [employer].

## 2017-03-21 ENCOUNTER — Ambulatory Visit (INDEPENDENT_AMBULATORY_CARE_PROVIDER_SITE_OTHER): Payer: BLUE CROSS/BLUE SHIELD

## 2017-03-21 DIAGNOSIS — R42 Dizziness and giddiness: Secondary | ICD-10-CM | POA: Diagnosis not present

## 2017-03-21 DIAGNOSIS — R29818 Other symptoms and signs involving the nervous system: Secondary | ICD-10-CM

## 2017-03-21 DIAGNOSIS — R51 Headache: Secondary | ICD-10-CM | POA: Diagnosis not present

## 2017-03-25 ENCOUNTER — Telehealth: Payer: Self-pay

## 2017-03-25 DIAGNOSIS — R202 Paresthesia of skin: Secondary | ICD-10-CM

## 2017-03-25 DIAGNOSIS — R29818 Other symptoms and signs involving the nervous system: Secondary | ICD-10-CM

## 2017-03-25 NOTE — Telephone Encounter (Signed)
I think she should see neuro

## 2017-03-25 NOTE — Telephone Encounter (Signed)
[facility] called stating that the pt contacted them to be scheduled to be evaluated for MS and Parkinson. Pt was seen for symptoms related to this on 03/16/17 by Dr. Denyse Amass.  Pt would need a referral placed to this location if appropriate.

## 2017-03-25 NOTE — Telephone Encounter (Signed)
Dr. Corey please see note below. Rhonda Cunningham,CMA  

## 2017-03-25 NOTE — Telephone Encounter (Signed)
Dr. Denyse Amass, do you feel that this is appropriate? I did not see her for this concern so just wanted to make sure that you are okay with this.

## 2017-03-28 NOTE — Telephone Encounter (Signed)
Ok to place referral for neuro

## 2017-03-28 NOTE — Telephone Encounter (Signed)
Referral placed.

## 2017-04-08 DIAGNOSIS — R202 Paresthesia of skin: Secondary | ICD-10-CM | POA: Diagnosis not present

## 2017-04-08 DIAGNOSIS — M79602 Pain in left arm: Secondary | ICD-10-CM | POA: Diagnosis not present

## 2017-04-08 DIAGNOSIS — M79601 Pain in right arm: Secondary | ICD-10-CM | POA: Diagnosis not present

## 2017-04-13 DIAGNOSIS — R202 Paresthesia of skin: Secondary | ICD-10-CM | POA: Diagnosis not present

## 2017-04-13 DIAGNOSIS — M542 Cervicalgia: Secondary | ICD-10-CM | POA: Diagnosis not present

## 2017-04-19 ENCOUNTER — Other Ambulatory Visit: Payer: Self-pay | Admitting: Family Medicine

## 2017-04-26 DIAGNOSIS — M9901 Segmental and somatic dysfunction of cervical region: Secondary | ICD-10-CM | POA: Diagnosis not present

## 2017-04-26 DIAGNOSIS — M9908 Segmental and somatic dysfunction of rib cage: Secondary | ICD-10-CM | POA: Diagnosis not present

## 2017-04-26 DIAGNOSIS — M542 Cervicalgia: Secondary | ICD-10-CM | POA: Diagnosis not present

## 2017-04-26 DIAGNOSIS — M9900 Segmental and somatic dysfunction of head region: Secondary | ICD-10-CM | POA: Diagnosis not present

## 2017-05-04 DIAGNOSIS — M9901 Segmental and somatic dysfunction of cervical region: Secondary | ICD-10-CM | POA: Diagnosis not present

## 2017-05-04 DIAGNOSIS — M542 Cervicalgia: Secondary | ICD-10-CM | POA: Diagnosis not present

## 2017-05-04 DIAGNOSIS — M9900 Segmental and somatic dysfunction of head region: Secondary | ICD-10-CM | POA: Diagnosis not present

## 2017-05-04 DIAGNOSIS — M9908 Segmental and somatic dysfunction of rib cage: Secondary | ICD-10-CM | POA: Diagnosis not present

## 2017-05-18 ENCOUNTER — Ambulatory Visit (INDEPENDENT_AMBULATORY_CARE_PROVIDER_SITE_OTHER): Payer: BLUE CROSS/BLUE SHIELD | Admitting: Family Medicine

## 2017-05-18 ENCOUNTER — Encounter: Payer: Self-pay | Admitting: Family Medicine

## 2017-05-18 VITALS — BP 130/81 | HR 61 | Ht 71.0 in | Wt 169.0 lb

## 2017-05-18 DIAGNOSIS — J069 Acute upper respiratory infection, unspecified: Secondary | ICD-10-CM

## 2017-05-18 DIAGNOSIS — F32A Depression, unspecified: Secondary | ICD-10-CM

## 2017-05-18 DIAGNOSIS — F329 Major depressive disorder, single episode, unspecified: Secondary | ICD-10-CM

## 2017-05-18 DIAGNOSIS — N63 Unspecified lump in unspecified breast: Secondary | ICD-10-CM | POA: Insufficient documentation

## 2017-05-18 MED ORDER — BUPROPION HCL ER (XL) 300 MG PO TB24: 300.0000 mg | ORAL_TABLET | Freq: Every day | ORAL | 3 refills | Status: DC

## 2017-05-18 MED ORDER — SERTRALINE HCL 50 MG PO TABS: 50.0000 mg | ORAL_TABLET | Freq: Every day | ORAL | 3 refills | Status: DC

## 2017-05-18 MED ORDER — BUPROPION HCL ER (XL) 300 MG PO TB24: 300.0000 mg | ORAL_TABLET | Freq: Every day | ORAL | 0 refills | Status: DC

## 2017-05-18 MED ORDER — SERTRALINE HCL 50 MG PO TABS: 50.0000 mg | ORAL_TABLET | Freq: Every day | ORAL | 0 refills | Status: DC

## 2017-05-18 NOTE — Progress Notes (Signed)
   Subjective:    Patient ID: Melinda KinnierHeather Reynolds, female    DOB: 08/01/1975, 41 y.o.   MRN: [id number]  HPI 41-year-old female comes in today to follow-up for depression. We have not followed up for this specific issue in quite some time. She's currently taking sertraline 59 g daily as well as Wellbutrin XL 3 mg daily. She is also on birth control.  She still struggles with sleep. She is able to fall asleep easily but wakes up frequently.  She has had URI sxs for about 3 days with nasal congestion, cough and draInage.  She was around her nephew at Thanksgiving he was coughing in her face. No fevers chills or sweats.  Review of Systems     Objective:   Physical Exam  Constitutional: She is oriented to person, place, and time. She appears well-developed and well-nourished.  HENT:  Head: Normocephalic and atraumatic.  Right Ear: External ear normal.  Left Ear: External ear normal.  Nose: Nose normal.  Mouth/Throat: Oropharynx is clear and moist.  TMs and canals are clear.   Eyes: Conjunctivae and EOM are normal. Pupils are equal, round, and reactive to light.  Neck: Neck supple. No thyromegaly present.  Cardiovascular: Normal rate, regular rhythm and normal heart sounds.  Pulmonary/Chest: Effort normal and breath sounds normal. She has no wheezes.  Lymphadenopathy:    She has no cervical adenopathy.  Neurological: She is alert and oriented to person, place, and time.  Skin: Skin is warm and dry.  Psychiatric: She has a normal mood and affect.          Assessment & Plan:  Depression-overall well controlled. PHQ 9 score of 3 today and got 7 score of 0. She would like to continue her current regimen for another year. Medications refilled follow-up in one year.  Upper respiratory infection-likely viral. Recommend symptomatic care. If not better in one week please give us a call back.

## 2017-05-31 DIAGNOSIS — J029 Acute pharyngitis, unspecified: Secondary | ICD-10-CM | POA: Diagnosis not present

## 2017-05-31 DIAGNOSIS — J0140 Acute pansinusitis, unspecified: Secondary | ICD-10-CM | POA: Diagnosis not present

## 2017-09-01 DIAGNOSIS — L0889 Other specified local infections of the skin and subcutaneous tissue: Secondary | ICD-10-CM | POA: Diagnosis not present

## 2017-10-06 ENCOUNTER — Ambulatory Visit: Payer: BLUE CROSS/BLUE SHIELD | Admitting: Podiatry

## 2017-10-06 ENCOUNTER — Encounter: Payer: Self-pay | Admitting: Podiatry

## 2017-10-06 DIAGNOSIS — B351 Tinea unguium: Secondary | ICD-10-CM

## 2017-10-06 DIAGNOSIS — M21969 Unspecified acquired deformity of unspecified lower leg: Secondary | ICD-10-CM | POA: Diagnosis not present

## 2017-10-06 DIAGNOSIS — M216X9 Other acquired deformities of unspecified foot: Secondary | ICD-10-CM | POA: Diagnosis not present

## 2017-10-06 NOTE — Progress Notes (Signed)
  Subjective: 42 y.o. year old female patient presents stating that she may have possible fungus on right great toe. Had done trimming but still see some abnormality. Concerned how to remove pitted callus under the ball of both feet. Tried Clindamycin and recent oral antibiotics that has helped almost.  Objective: Dermatologic: Thick and deformed nail border distal medial right great toe with fungal debris. Vascular: Pedal pulses are all palpable. Orthopedic: Hypermobile first ray bilateral. Neurologic: All epicritic and tactile sensations grossly intact.  Assessment: Dystrophic mycotic nail left great toe. Hypermobile first ray bilateral. Pronation deformity.  Treatment: Reviewed findings and available treatment options. Advised to use Antibacterial soap scrub after each shower. Metatarsal binder dispensed with instruction.

## 2017-10-06 NOTE — Patient Instructions (Signed)
Seen for fungal nail and skin problem on both feet. Reviewed available treatment options, Antibiotic shampoo scrub and Arch binder to assist stability of the first ray. Return as needed.

## 2018-01-09 DIAGNOSIS — M5126 Other intervertebral disc displacement, lumbar region: Secondary | ICD-10-CM | POA: Diagnosis not present

## 2018-01-09 DIAGNOSIS — M545 Low back pain: Secondary | ICD-10-CM | POA: Diagnosis not present

## 2018-01-23 DIAGNOSIS — M5126 Other intervertebral disc displacement, lumbar region: Secondary | ICD-10-CM | POA: Diagnosis not present

## 2018-01-23 DIAGNOSIS — M545 Low back pain: Secondary | ICD-10-CM | POA: Diagnosis not present

## 2018-02-23 ENCOUNTER — Other Ambulatory Visit: Payer: Self-pay | Admitting: Obstetrics and Gynecology

## 2018-02-23 DIAGNOSIS — Z1239 Encounter for other screening for malignant neoplasm of breast: Secondary | ICD-10-CM

## 2018-02-27 DIAGNOSIS — M545 Low back pain: Secondary | ICD-10-CM | POA: Diagnosis not present

## 2018-02-27 DIAGNOSIS — M5126 Other intervertebral disc displacement, lumbar region: Secondary | ICD-10-CM | POA: Diagnosis not present

## 2018-03-22 ENCOUNTER — Ambulatory Visit: Payer: BLUE CROSS/BLUE SHIELD

## 2018-04-06 DIAGNOSIS — Z01419 Encounter for gynecological examination (general) (routine) without abnormal findings: Secondary | ICD-10-CM | POA: Diagnosis not present

## 2018-04-06 DIAGNOSIS — Z1231 Encounter for screening mammogram for malignant neoplasm of breast: Secondary | ICD-10-CM | POA: Diagnosis not present

## 2018-05-31 ENCOUNTER — Other Ambulatory Visit: Payer: Self-pay | Admitting: Family Medicine

## 2018-05-31 DIAGNOSIS — F329 Major depressive disorder, single episode, unspecified: Secondary | ICD-10-CM

## 2018-05-31 DIAGNOSIS — F32A Depression, unspecified: Secondary | ICD-10-CM

## 2018-06-30 ENCOUNTER — Other Ambulatory Visit: Payer: Self-pay

## 2018-06-30 DIAGNOSIS — F329 Major depressive disorder, single episode, unspecified: Secondary | ICD-10-CM

## 2018-06-30 DIAGNOSIS — F32A Depression, unspecified: Secondary | ICD-10-CM

## 2018-06-30 MED ORDER — BUPROPION HCL ER (XL) 300 MG PO TB24: 300.0000 mg | ORAL_TABLET | Freq: Every day | ORAL | 0 refills | Status: DC

## 2018-06-30 MED ORDER — SERTRALINE HCL 50 MG PO TABS: 50.0000 mg | ORAL_TABLET | Freq: Every day | ORAL | 0 refills | Status: DC

## 2018-07-19 ENCOUNTER — Ambulatory Visit (INDEPENDENT_AMBULATORY_CARE_PROVIDER_SITE_OTHER): Payer: BLUE CROSS/BLUE SHIELD | Admitting: Family Medicine

## 2018-07-19 ENCOUNTER — Encounter: Payer: Self-pay | Admitting: Family Medicine

## 2018-07-19 VITALS — BP 119/75 | HR 64 | Temp 98.1°F | Ht 71.0 in | Wt 175.0 lb

## 2018-07-19 DIAGNOSIS — F324 Major depressive disorder, single episode, in partial remission: Secondary | ICD-10-CM | POA: Diagnosis not present

## 2018-07-19 DIAGNOSIS — F32A Depression, unspecified: Secondary | ICD-10-CM

## 2018-07-19 DIAGNOSIS — F329 Major depressive disorder, single episode, unspecified: Secondary | ICD-10-CM

## 2018-07-19 DIAGNOSIS — Z111 Encounter for screening for respiratory tuberculosis: Secondary | ICD-10-CM | POA: Diagnosis not present

## 2018-07-19 DIAGNOSIS — J069 Acute upper respiratory infection, unspecified: Secondary | ICD-10-CM | POA: Diagnosis not present

## 2018-07-19 DIAGNOSIS — Z23 Encounter for immunization: Secondary | ICD-10-CM

## 2018-07-19 MED ORDER — BUPROPION HCL ER (XL) 300 MG PO TB24: 300.0000 mg | ORAL_TABLET | Freq: Every day | ORAL | 3 refills | Status: DC

## 2018-07-19 MED ORDER — SERTRALINE HCL 50 MG PO TABS: 50.0000 mg | ORAL_TABLET | Freq: Every day | ORAL | 3 refills | Status: DC

## 2018-07-19 NOTE — Patient Instructions (Signed)
Follow up with me in one year.

## 2018-07-19 NOTE — Progress Notes (Signed)
Subjective:    CC: F/U medication  HPI:  43 year old female is here today to follow-up for depression.  She was last seen in November.  She is actually doing really well.  She feels like her current regimen is a good balance for her mood.  Sleep is fair some nights she will sleep well others she will just wake up frequently.  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  She will need to make sure that her vaccines are up-to-date.  I do not have a copy of childhood vaccines but her tetanus is up-to-date and she is going to get her flu shot today.  She also needs a TB skin test placed.  She has had some URI sxs since the weekend. No fever.  She is had mostly nasal congestion headache and sinus pressure.  She says her left ear has actually been, hurting on and off for the last couple of weeks.  No drainage from the ear.  Past medical history, Surgical history, Family history not pertinant except as noted below, Social history, Allergies, and medications have been entered into the medical record, reviewed, and corrections made.   Review of Systems: No fevers, chills, night sweats, weight loss, chest pain, or shortness of breath.   Objective:    General: Well Developed, well nourished, and in no acute distress.  Neuro: Alert and oriented x3, extra-ocular muscles intact, sensation grossly intact.  HEENT: Normocephalic, atraumatic oropharynx is only erythematous, TMs and canals are clear bilaterally.  No significant cervical lymphadenopathy. Skin: Warm and dry, no rashes. Cardiac: Regular rate and rhythm, no murmurs rubs or gallops, no lower extremity edema.  Respiratory: Clear to auscultation bilaterally. Not using accessory muscles, speaking in full sentences.   Impression and Recommendations:    Depression, in partial remission-she is doing very well on her current regimen.  We will refill medications today.   Follow-up in 1 year she does not want to discontinue them or alter them in any way.  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  I be happy to fill that out.  Go ahead and place TB skin test today.  Will update influenza vaccine.  Her tetanus is already up-to-date.  We will call Dr. Kittie Plater office for her most recent mammogram reports she did get one done in 2019.  Upper respiratory infection-likely viral.  Call if not better by early next week.

## 2018-07-21 ENCOUNTER — Ambulatory Visit (INDEPENDENT_AMBULATORY_CARE_PROVIDER_SITE_OTHER): Payer: BLUE CROSS/BLUE SHIELD | Admitting: Family Medicine

## 2018-07-21 VITALS — BP 119/70 | HR 57

## 2018-07-21 DIAGNOSIS — Z111 Encounter for screening for respiratory tuberculosis: Secondary | ICD-10-CM

## 2018-07-21 LAB — TB SKIN TEST
Induration: 0 mm
TB Skin Test: NEGATIVE

## 2018-07-21 NOTE — Progress Notes (Signed)
Pt came into clinic today for PPD skin read. 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 PPD test was negative. Form completed by Provider. No further questions.

## 2018-07-21 NOTE — Progress Notes (Signed)
Agree with documentation as above.   Shaketta Rill, MD  

## 2018-07-31 ENCOUNTER — Telehealth: Payer: Self-pay

## 2018-07-31 NOTE — Telephone Encounter (Signed)
Melinda Reynolds called and states she is not better. She has a runny nose, chills, sneezing and sinus pressure. Denies fever or sweats. She was treated with Tamiflu last Monday due to her kids being positive for the flu. She did feel a little better for two days on the Tamiflu. Please advise.

## 2018-08-01 MED ORDER — AMOXICILLIN-POT CLAVULANATE 875-125 MG PO TABS: 1.0000 | ORAL_TABLET | Freq: Two times a day (BID) | ORAL | 0 refills | Status: DC

## 2018-08-01 NOTE — Telephone Encounter (Signed)
New rx went for augmentin.

## 2018-08-01 NOTE — Telephone Encounter (Signed)
Left pt detailed msg on ID'd cell VM that RX was sent

## 2018-12-30 DIAGNOSIS — R51 Headache: Secondary | ICD-10-CM | POA: Diagnosis not present

## 2018-12-30 DIAGNOSIS — R509 Fever, unspecified: Secondary | ICD-10-CM | POA: Diagnosis not present

## 2018-12-30 DIAGNOSIS — Z20828 Contact with and (suspected) exposure to other viral communicable diseases: Secondary | ICD-10-CM | POA: Diagnosis not present

## 2019-01-31 DIAGNOSIS — H00014 Hordeolum externum left upper eyelid: Secondary | ICD-10-CM | POA: Diagnosis not present

## 2019-05-22 IMAGING — MG 2D DIGITAL DIAGNOSTIC UNILATERAL LEFT MAMMOGRAM WITH CAD AND ADJ
6 series · 6 of 14 positions shown · non-contrast
Comparison: Previous exam(s).

CLINICAL DATA: Screening recall for a possible asymmetry in the
left breast.

EXAM:
2D DIGITAL DIAGNOSTIC LEFT MAMMOGRAM WITH CAD AND ADJUNCT TOMO
ULTRASOUND LEFT BREAST

[L CC]
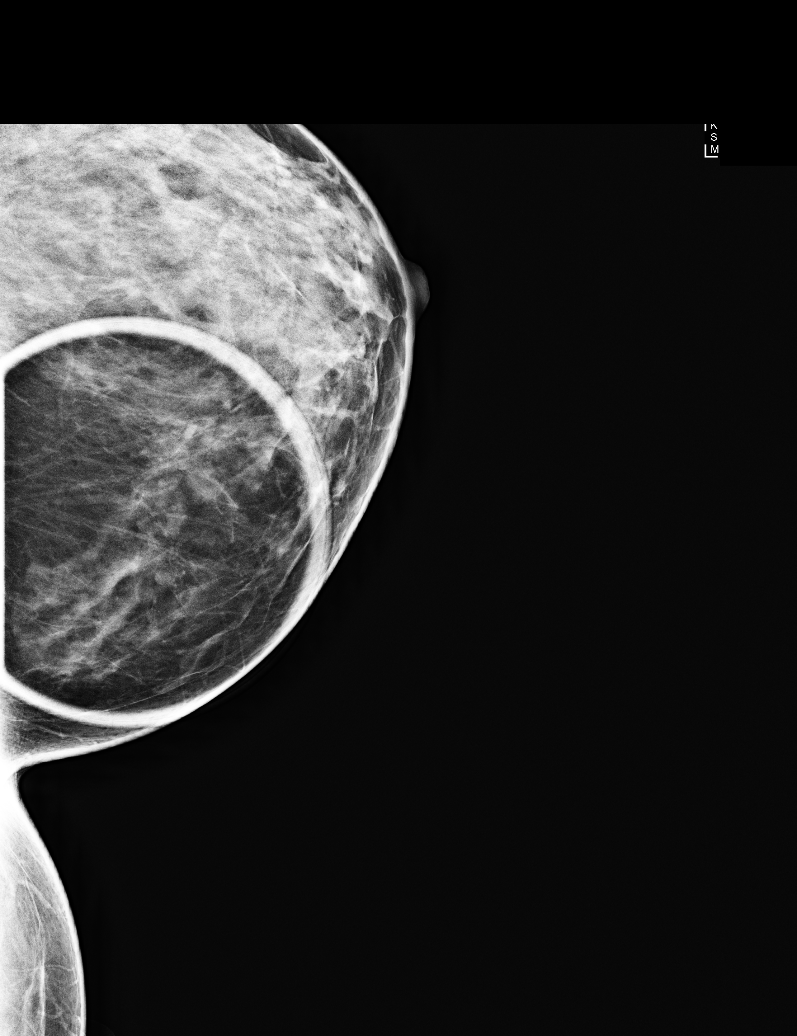

[L MLO synth-2D]
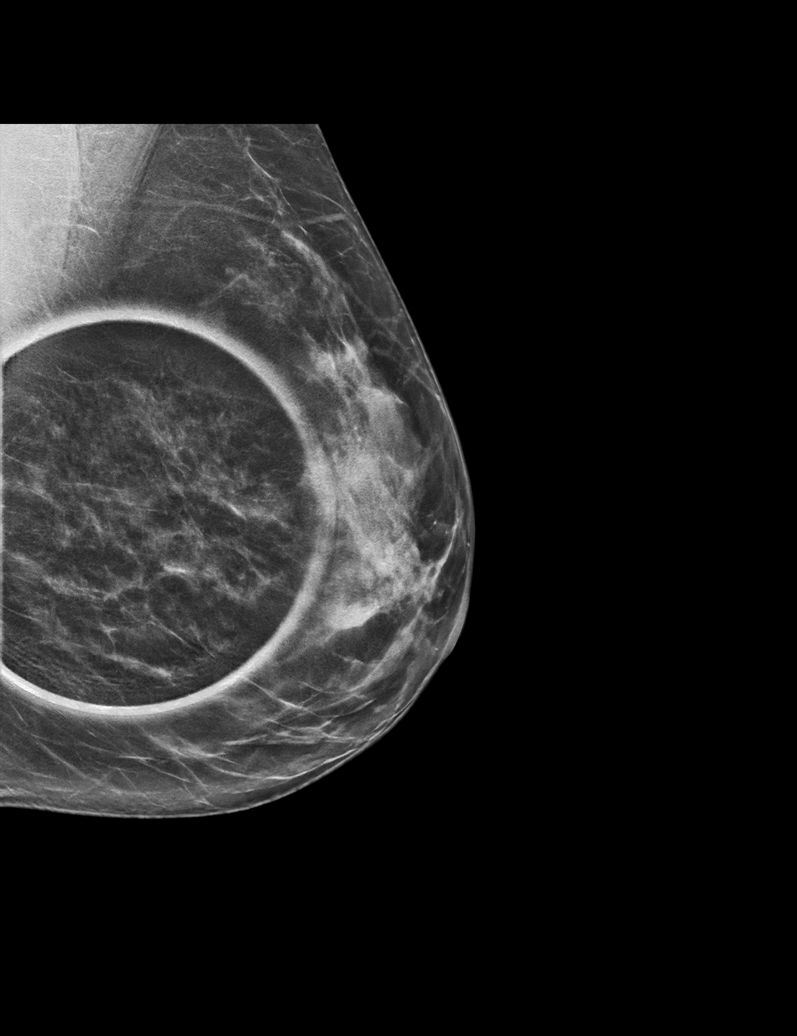

[L CC synth-2D]
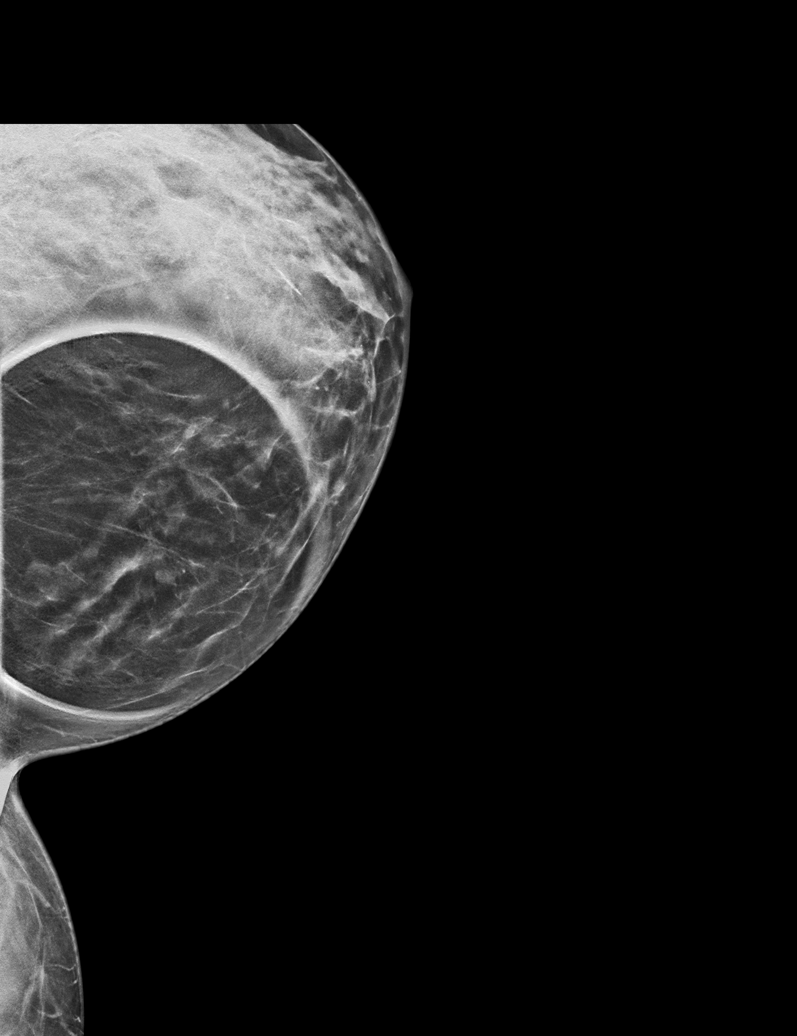

[L MLO]
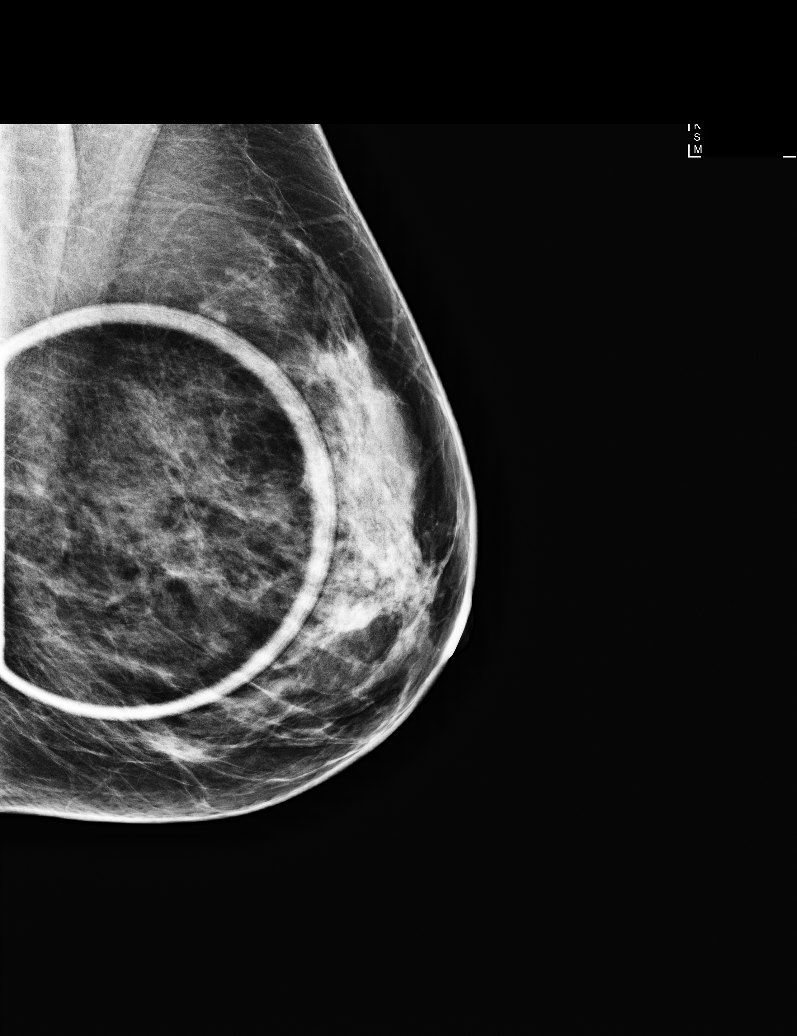

[L CC tomo · tomo slice 27/54.0]
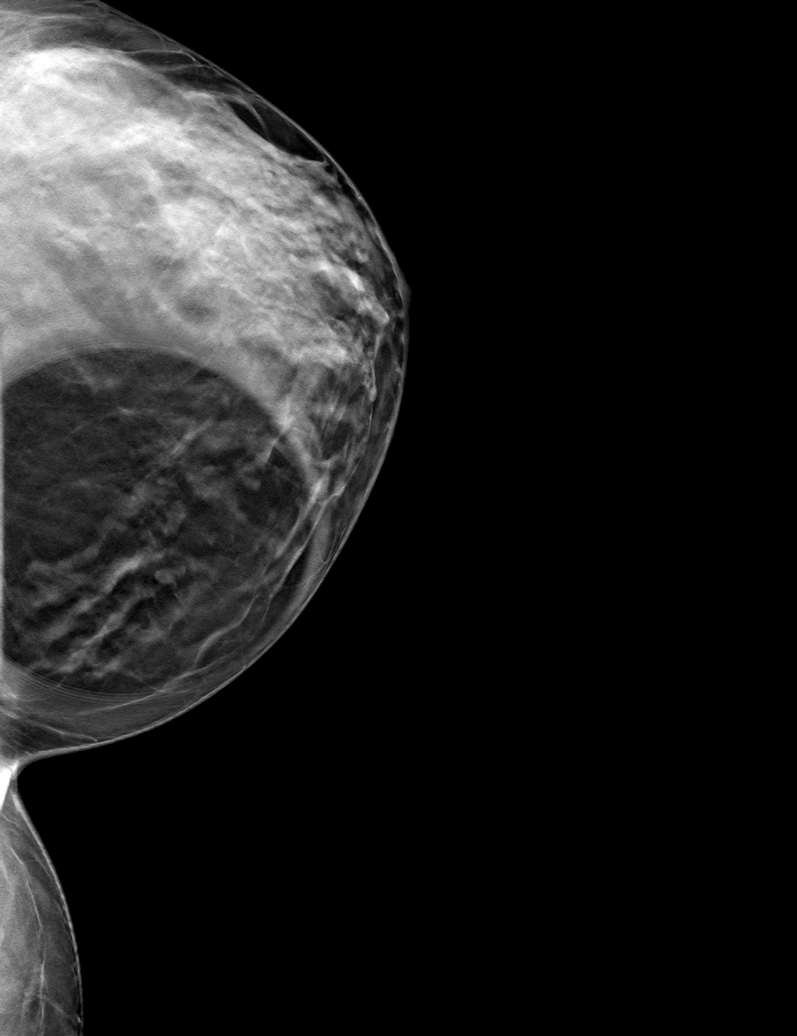

[L MLO tomo · tomo slice 33/64.0]
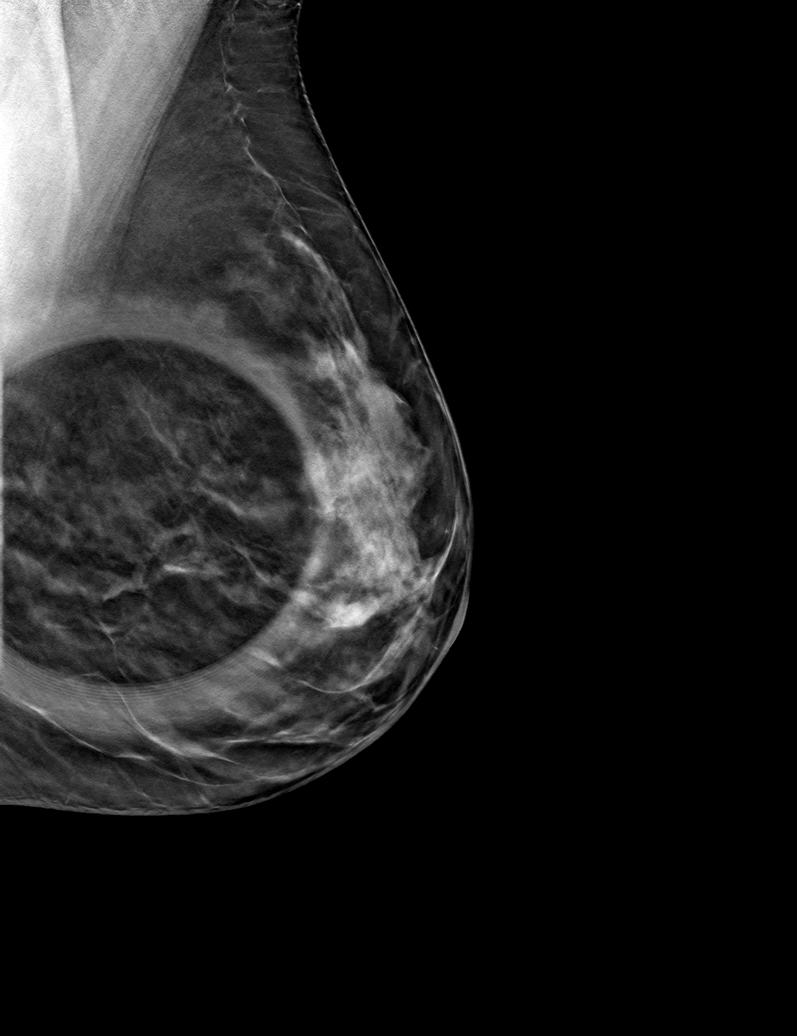

[6 of 14 positions shown; findings below may reference images not displayed]

ACR Breast Density Category c: The breast tissue is heterogeneously
dense, which may obscure small masses.
FINDINGS: The area of possible asymmetry in the medial left breast disperses
with spot compression. Has mixed attenuation with no defined margins
consistent normal fibroglandular tissue. On a small oval
circumscribed mass is seen along the anterior margin of this. There
is no architectural distortion. No suspicious calcifications.

Mammographic images were processed with CAD.

On physical exam, no mass is palpated along the medial left breast.

Targeted ultrasound is performed, showing heterogeneous
fibroglandular tissue along the medial aspect the left breast with
several small cysts, the largest lying at 9 o'clock, 4 cm the
nipple, measuring 3 mm, consistent with a small mass seen
mammographically. There are no solid masses or suspicious lesions.
IMPRESSION: 1. No evidence of malignancy.
2. Benign asymmetric area of fibroglandular tissue along the medial
aspect of the left breast. Small benign left breast cysts.

RECOMMENDATION:
Screening mammogram in one year.(Code:69-O-Z21)

I have discussed the findings and recommendations with the patient.
Results were also provided in writing at the conclusion of the
visit. If applicable, a reminder letter will be sent to the patient
regarding the next appointment.

BI-RADS CATEGORY  2: Benign.

## 2019-05-23 DIAGNOSIS — Z01419 Encounter for gynecological examination (general) (routine) without abnormal findings: Secondary | ICD-10-CM | POA: Diagnosis not present

## 2019-05-23 DIAGNOSIS — Z6824 Body mass index (BMI) 24.0-24.9, adult: Secondary | ICD-10-CM | POA: Diagnosis not present

## 2019-05-23 DIAGNOSIS — Z1231 Encounter for screening mammogram for malignant neoplasm of breast: Secondary | ICD-10-CM | POA: Diagnosis not present

## 2019-05-24 ENCOUNTER — Other Ambulatory Visit: Payer: Self-pay | Admitting: Obstetrics and Gynecology

## 2019-05-24 DIAGNOSIS — R928 Other abnormal and inconclusive findings on diagnostic imaging of breast: Secondary | ICD-10-CM

## 2019-05-30 ENCOUNTER — Ambulatory Visit
Admission: RE | Admit: 2019-05-30 | Discharge: 2019-05-30 | Disposition: A | Discharge status: Home / Self Care | Payer: BC Managed Care – PPO | Source: Ambulatory Visit | Attending: Obstetrics and Gynecology | Admitting: Obstetrics and Gynecology

## 2019-05-30 ENCOUNTER — Other Ambulatory Visit: Payer: Self-pay

## 2019-05-30 ENCOUNTER — Ambulatory Visit: Payer: Self-pay

## 2019-05-30 DIAGNOSIS — R922 Inconclusive mammogram: Secondary | ICD-10-CM | POA: Diagnosis not present

## 2019-05-30 DIAGNOSIS — R928 Other abnormal and inconclusive findings on diagnostic imaging of breast: Secondary | ICD-10-CM

## 2019-07-12 DIAGNOSIS — Z20822 Contact with and (suspected) exposure to covid-19: Secondary | ICD-10-CM | POA: Diagnosis not present

## 2019-08-26 DIAGNOSIS — Z20822 Contact with and (suspected) exposure to covid-19: Secondary | ICD-10-CM | POA: Diagnosis not present

## 2019-08-26 DIAGNOSIS — R0981 Nasal congestion: Secondary | ICD-10-CM | POA: Diagnosis not present

## 2019-08-26 DIAGNOSIS — R509 Fever, unspecified: Secondary | ICD-10-CM | POA: Diagnosis not present

## 2019-08-26 DIAGNOSIS — R519 Headache, unspecified: Secondary | ICD-10-CM | POA: Diagnosis not present

## 2019-08-29 ENCOUNTER — Other Ambulatory Visit: Payer: Self-pay | Admitting: Family Medicine

## 2019-08-29 DIAGNOSIS — F329 Major depressive disorder, single episode, unspecified: Secondary | ICD-10-CM

## 2019-08-29 DIAGNOSIS — F32A Depression, unspecified: Secondary | ICD-10-CM

## 2019-08-30 ENCOUNTER — Other Ambulatory Visit: Payer: Self-pay

## 2019-08-30 DIAGNOSIS — F32A Depression, unspecified: Secondary | ICD-10-CM

## 2019-08-30 DIAGNOSIS — F329 Major depressive disorder, single episode, unspecified: Secondary | ICD-10-CM

## 2019-08-30 MED ORDER — SERTRALINE HCL 50 MG PO TABS: 50.0000 mg | ORAL_TABLET | Freq: Every day | ORAL | 0 refills | Status: DC

## 2019-08-30 MED ORDER — BUPROPION HCL ER (XL) 300 MG PO TB24: 300.0000 mg | ORAL_TABLET | Freq: Every day | ORAL | 0 refills | Status: DC

## 2019-08-30 NOTE — Telephone Encounter (Signed)
Arriana states she has an appointment next Tuesday and needs medication refill to get her to the appointment. Advised to keep up coming appointment.

## 2019-09-04 ENCOUNTER — Encounter: Payer: Self-pay | Admitting: Family Medicine

## 2019-09-04 ENCOUNTER — Other Ambulatory Visit: Payer: Self-pay

## 2019-09-04 ENCOUNTER — Ambulatory Visit (INDEPENDENT_AMBULATORY_CARE_PROVIDER_SITE_OTHER): Payer: BC Managed Care – PPO | Admitting: Family Medicine

## 2019-09-04 VITALS — BP 132/79 | HR 56 | Ht 71.0 in | Wt 178.0 lb

## 2019-09-04 DIAGNOSIS — Z5181 Encounter for therapeutic drug level monitoring: Secondary | ICD-10-CM

## 2019-09-04 DIAGNOSIS — F329 Major depressive disorder, single episode, unspecified: Secondary | ICD-10-CM | POA: Diagnosis not present

## 2019-09-04 DIAGNOSIS — Z13220 Encounter for screening for lipoid disorders: Secondary | ICD-10-CM

## 2019-09-04 DIAGNOSIS — F324 Major depressive disorder, single episode, in partial remission: Secondary | ICD-10-CM | POA: Diagnosis not present

## 2019-09-04 DIAGNOSIS — F32A Depression, unspecified: Secondary | ICD-10-CM

## 2019-09-04 MED ORDER — SERTRALINE HCL 50 MG PO TABS: 50.0000 mg | ORAL_TABLET | Freq: Every day | ORAL | 1 refills | Status: DC

## 2019-09-04 MED ORDER — BUPROPION HCL ER (XL) 300 MG PO TB24: 300.0000 mg | ORAL_TABLET | Freq: Every day | ORAL | 1 refills | Status: DC

## 2019-09-04 NOTE — Progress Notes (Signed)
Doing well on current regimen .  

## 2019-09-04 NOTE — Assessment & Plan Note (Signed)
Happy with current regimen doing well overall.  Due for some screening blood work so we will get that updated as well.  Otherwise continue current regimen and follow-up in 1 year or sooner if needed.

## 2019-09-04 NOTE — Progress Notes (Signed)
Established Patient Office Visit  Subjective:  Patient ID: Melinda Reynolds, female    DOB: [birth year]  Age: 44 y.o. MRN: [id number]  CC:  Chief Complaint  Patient presents with  . mood    HPI Ginelle Bays presents for depression.  Currently on sertraline 50 mg and Wellbutrin 300 mg.  She is been on this regimen for quite some time.  PHQ-9 score of 3 today.  Does report some difficulty with concentration nearly every day.  No thoughts of wanting to harm herself.  She is happy on her current regimen. No side effects.  She does not feel like it needs any adjustments.  Her kids have been in school this year even with Covid as they are in a local private school.  Past Medical History:  Diagnosis Date  . No pertinent past medical history     Past Surgical History:  Procedure Laterality Date  . BREAST BIOPSY     mole removed from breast  . CESAREAN SECTION  09/16/2011   Procedure: CESAREAN SECTION;  Surgeon: Loney Laurence, MD;  Location: [facility];  Service: Gynecology;  Laterality: N/A;  Twins  . TONSILLECTOMY    . WISDOM TOOTH EXTRACTION      Family History  Problem Relation Age of Onset  . Heart attack Father   . Hyperlipidemia Father   . Lung cancer Father        small cell  . Diabetes Maternal Grandfather     Social History   Socioeconomic History  . Marital status: Married    Spouse name: Gregary Signs    . Number of children: 2  . Years of education: Not on file  . Highest education level: Not on file  Occupational History  . Occupation: Stay at home mom.   Tobacco Use  . Smoking status: Never Smoker  . Smokeless tobacco: Never Used  Substance and Sexual Activity  . Alcohol use: Yes    Alcohol/week: 7.0 - 10.0 standard drinks    Types: 7 - 10 Glasses of wine per week  . Drug use: No  . Sexual activity: Yes    Partners: Male    Birth control/protection: None  Other Topics Concern  . Not on file  Social History Narrative   Some regular exercise.     Social  Determinants of Health   Financial Resource Strain:   . Difficulty of Paying Living Expenses:   Food Insecurity:   . Worried About Programme researcher, broadcasting/film/video in the Last Year:   . Barista in the Last Year:   Transportation Needs:   . Freight forwarder (Medical):   Marland Kitchen Lack of Transportation (Non-Medical):   Physical Activity:   . Days of Exercise per Week:   . Minutes of Exercise per Session:   Stress:   . Feeling of Stress :   Social Connections:   . Frequency of Communication with Friends and Family:   . Frequency of Social Gatherings with Friends and Family:   . Attends Religious Services:   . Active Member of Clubs or Organizations:   . Attends Banker Meetings:   Marland Kitchen Marital Status:   Intimate Partner Violence:   . Fear of Current or Ex-Partner:   . Emotionally Abused:   Marland Kitchen Physically Abused:   . Sexually Abused:     Outpatient Medications Prior to Visit  Medication Sig Dispense Refill  . clindamycin (CLINDAGEL) 1 % gel Apply to affected areas daily after washing    .  buPROPion (WELLBUTRIN XL) 300 MG 24 hr tablet Take 1 tablet (300 mg total) by mouth daily. 30 tablet 0  . sertraline (ZOLOFT) 50 MG tablet Take 1 tablet (50 mg total) by mouth daily. 30 tablet 0  . amoxicillin-clavulanate (AUGMENTIN) 875-125 MG tablet Take 1 tablet by mouth 2 (two) times daily. 20 tablet 0   No facility-administered medications prior to visit.    No Known Allergies  ROS Review of Systems    Objective:    Physical Exam  Constitutional: She is oriented to person, place, and time. She appears well-developed and well-nourished.  HENT:  Head: Normocephalic and atraumatic.  Cardiovascular: Normal rate, regular rhythm and normal heart sounds.  Pulmonary/Chest: Effort normal and breath sounds normal.  Neurological: She is alert and oriented to person, place, and time.  Skin: Skin is warm and dry.  Psychiatric: She has a normal mood and affect. Her behavior is normal.     BP 132/79   Pulse (!) 56   Ht 5\' 11"  (1.803 m)   Wt 178 lb (80.7 kg)   SpO2 98%   BMI 24.83 kg/m  Wt Readings from Last 3 Encounters:  09/04/19 178 lb (80.7 kg)  07/19/18 175 lb (79.4 kg)  05/18/17 169 lb (76.7 kg)     Health Maintenance Due  Topic Date Due  . PAP SMEAR-Modifier  10/20/2019    There are no preventive care reminders to display for this patient.  Lab Results  Component Value Date   TSH 2.493 02/07/2015   Lab Results  Component Value Date   WBC 5.1 03/09/2016   HGB 14.2 03/09/2016   HCT 41.6 03/09/2016   MCV 90.4 03/09/2016   PLT 295 03/09/2016   Lab Results  Component Value Date   NA 137 03/09/2016   K 5.0 03/09/2016   CO2 26 03/09/2016   GLUCOSE 96 03/09/2016   BUN 13 03/09/2016   CREATININE 1.00 03/09/2016   BILITOT 0.8 03/09/2016   ALKPHOS 51 03/09/2016   AST 22 03/09/2016   ALT 14 03/09/2016   PROT 6.6 03/09/2016   ALBUMIN 4.6 03/09/2016   CALCIUM 9.4 03/09/2016   Lab Results  Component Value Date   CHOL 183 11/25/2014   Lab Results  Component Value Date   HDL 106 11/25/2014   Lab Results  Component Value Date   LDLCALC 70 11/25/2014   Lab Results  Component Value Date   TRIG 36 11/25/2014   Lab Results  Component Value Date   CHOLHDL 1.7 11/25/2014   No results found for: HGBA1C    Assessment & Plan:   Problem List Items Addressed This Visit      Other   Depression, major, single episode, in partial remission ([place]) - Primary    Happy with current regimen doing well overall.  Due for some screening blood work so we will get that updated as well.  Otherwise continue current regimen and follow-up in 1 year or sooner if needed.      Relevant Medications   sertraline (ZOLOFT) 50 MG tablet   buPROPion (WELLBUTRIN XL) 300 MG 24 hr tablet    Other Visit Diagnoses    Depression, acute       Relevant Medications   sertraline (ZOLOFT) 50 MG tablet   buPROPion (WELLBUTRIN XL) 300 MG 24 hr tablet   Screening, lipid        Relevant Orders   Lipid panel   Encounter for medication monitoring       Relevant Orders  COMPLETE METABOLIC PANEL WITH GFR   CBC   Lipid panel      Meds ordered this encounter  Medications  . DISCONTD: buPROPion (WELLBUTRIN XL) 300 MG 24 hr tablet    Sig: Take 1 tablet (300 mg total) by mouth daily.    Dispense:  90 tablet    Refill:  1  . DISCONTD: sertraline (ZOLOFT) 50 MG tablet    Sig: Take 1 tablet (50 mg total) by mouth daily.    Dispense:  90 tablet    Refill:  1  . sertraline (ZOLOFT) 50 MG tablet    Sig: Take 1 tablet (50 mg total) by mouth daily.    Dispense:  90 tablet    Refill:  1  . buPROPion (WELLBUTRIN XL) 300 MG 24 hr tablet    Sig: Take 1 tablet (300 mg total) by mouth daily.    Dispense:  90 tablet    Refill:  1    Follow-up: Return in about 1 year (around 09/03/2020) for Mood medicaitons.    Nani Gasser, MD

## 2019-11-05 DIAGNOSIS — U071 COVID-19: Secondary | ICD-10-CM | POA: Diagnosis not present

## 2019-11-10 DIAGNOSIS — Z03818 Encounter for observation for suspected exposure to other biological agents ruled out: Secondary | ICD-10-CM | POA: Diagnosis not present

## 2019-11-10 DIAGNOSIS — Z20822 Contact with and (suspected) exposure to covid-19: Secondary | ICD-10-CM | POA: Diagnosis not present

## 2019-11-10 DIAGNOSIS — Z20828 Contact with and (suspected) exposure to other viral communicable diseases: Secondary | ICD-10-CM | POA: Diagnosis not present

## 2019-11-23 ENCOUNTER — Ambulatory Visit (INDEPENDENT_AMBULATORY_CARE_PROVIDER_SITE_OTHER): Payer: BC Managed Care – PPO | Admitting: Nurse Practitioner

## 2019-11-23 ENCOUNTER — Other Ambulatory Visit: Payer: Self-pay

## 2019-11-23 ENCOUNTER — Encounter: Payer: Self-pay | Admitting: Nurse Practitioner

## 2019-11-23 VITALS — BP 125/78 | HR 62 | Temp 98.1°F | Ht 71.0 in | Wt 179.3 lb

## 2019-11-23 DIAGNOSIS — N644 Mastodynia: Secondary | ICD-10-CM

## 2019-11-23 NOTE — Progress Notes (Signed)
Acute Office Visit  Subjective:    Patient ID: Melinda Reynolds, female    DOB: 14-Aug-1975, 44 y.o.   MRN: [id number]  Chief Complaint  Patient presents with  . Chest Pain    left sided chest pain, above breast area, onset 10 d ago, dull, aching    HPI Patient is in today for pain in her left breast that started about 10 days ago. She reports the pain is intermittent and not associated with any activity, movement, breathing, or clothing. She describes the pain as dull and achy.   She denies shortness of breath, palpitations, chest pain, dizziness, nausea, cough, nipple discharge, change in the appearance of her breasts, change in breast tissue, or pain with movement, breathing, coughing.   She does not have any immediate family history of breast cancer and her last mammogram was December 2020, which showed the presence of fibrocystic breast tissue.   Past Medical History:  Diagnosis Date  . No pertinent past medical history     Past Surgical History:  Procedure Laterality Date  . BREAST BIOPSY     mole removed from breast  . CESAREAN SECTION  09/16/2011   Procedure: CESAREAN SECTION;  Surgeon: Daria Pastures, MD;  Location: [facility];  Service: Gynecology;  Laterality: N/A;  Twins  . TONSILLECTOMY    . WISDOM TOOTH EXTRACTION      Family History  Problem Relation Age of Onset  . Heart attack Father   . Hyperlipidemia Father   . Lung cancer Father        small cell  . Diabetes Maternal Grandfather     Social History   Socioeconomic History  . Marital status: Married    Spouse name: Hilliard Clark    . Number of children: 2  . Years of education: Not on file  . Highest education level: Not on file  Occupational History  . Occupation: Stay at home mom.   Tobacco Use  . Smoking status: Never Smoker  . Smokeless tobacco: Never Used  Substance and Sexual Activity  . Alcohol use: Yes    Alcohol/week: 7.0 - 10.0 standard drinks    Types: 7 - 10 Glasses of wine per week  . Drug  use: No  . Sexual activity: Yes    Partners: Male    Birth control/protection: None  Other Topics Concern  . Not on file  Social History Narrative   Some regular exercise.     Social Determinants of Health   Financial Resource Strain:   . Difficulty of Paying Living Expenses:   Food Insecurity:   . Worried About Charity fundraiser in the Last Year:   . Arboriculturist in the Last Year:   Transportation Needs:   . Film/video editor (Medical):   Marland Kitchen Lack of Transportation (Non-Medical):   Physical Activity:   . Days of Exercise per Week:   . Minutes of Exercise per Session:   Stress:   . Feeling of Stress :   Social Connections:   . Frequency of Communication with Friends and Family:   . Frequency of Social Gatherings with Friends and Family:   . Attends Religious Services:   . Active Member of Clubs or Organizations:   . Attends Archivist Meetings:   Marland Kitchen Marital Status:   Intimate Partner Violence:   . Fear of Current or Ex-Partner:   . Emotionally Abused:   Marland Kitchen Physically Abused:   . Sexually Abused:     Outpatient  Medications Prior to Visit  Medication Sig Dispense Refill  . buPROPion (WELLBUTRIN XL) 300 MG 24 hr tablet Take 1 tablet (300 mg total) by mouth daily. 90 tablet 1  . sertraline (ZOLOFT) 50 MG tablet Take 1 tablet (50 mg total) by mouth daily. 90 tablet 1  . clindamycin (CLINDAGEL) 1 % gel Apply to affected areas daily after washing     No facility-administered medications prior to visit.    No Known Allergies  Review of Systems  Constitutional: Negative for activity change, appetite change, chills, fatigue and fever.  Respiratory: Negative for cough, chest tightness, shortness of breath and wheezing.   Cardiovascular: Negative for palpitations and leg swelling.       Chest wall pain present over the left breast at the 12 o'clock position.   Gastrointestinal: Negative for abdominal pain.  Musculoskeletal: Negative for arthralgias, back  pain, joint swelling, myalgias and neck pain.  Skin: Negative for color change, pallor, rash and wound.  Neurological: Negative for dizziness, tremors, weakness, light-headedness and headaches.       Objective:    Physical Exam Constitutional:      Appearance: She is well-developed and normal weight.  HENT:     Head: Normocephalic.  Eyes:     Extraocular Movements: Extraocular movements intact.     Pupils: Pupils are equal, round, and reactive to light.  Cardiovascular:     Rate and Rhythm: Normal rate and regular rhythm.  No extrasystoles are present.    Chest Wall: PMI is not displaced.     Pulses:          Carotid pulses are 2+ on the right side and 2+ on the left side.      Radial pulses are 2+ on the right side and 2+ on the left side.       Dorsalis pedis pulses are 2+ on the right side and 2+ on the left side.       Posterior tibial pulses are 2+ on the right side and 2+ on the left side.     Heart sounds: Normal heart sounds. Heart sounds not distant. No murmur. No friction rub.  Pulmonary:     Effort: Pulmonary effort is normal. No tachypnea, accessory muscle usage or respiratory distress.     Breath sounds: Normal breath sounds.  Chest:     Chest wall: Tenderness present. No deformity, crepitus or edema. There is no dullness to percussion.       Comments: No changes to the skin of the breast. Nipples equal bilaterally. No masses, dimpling, or deformity noted.  Abdominal:     Palpations: Abdomen is soft.  Musculoskeletal:        General: Normal range of motion.     Cervical back: Normal range of motion.     Right lower leg: No edema.     Left lower leg: No edema.  Skin:    General: Skin is warm and dry.     Capillary Refill: Capillary refill takes less than 2 seconds.  Neurological:     General: No focal deficit present.     Mental Status: She is alert and oriented to person, place, and time.  Psychiatric:        Mood and Affect: Mood normal.        Behavior:  Behavior normal.     BP 125/78   Pulse 62   Temp 98.1 F (36.7 C) (Oral)   Ht 5\' 11"  (1.803 m)   Wt 179 lb  4.8 oz (81.3 kg)   SpO2 100%   BMI 25.01 kg/m  Wt Readings from Last 3 Encounters:  11/23/19 179 lb 4.8 oz (81.3 kg)  09/04/19 178 lb (80.7 kg)  07/19/18 175 lb (79.4 kg)    Health Maintenance Due  Topic Date Due  . PAP SMEAR-Modifier  10/20/2019    There are no preventive care reminders to display for this patient.   Lab Results  Component Value Date   TSH 2.493 02/07/2015   Lab Results  Component Value Date   WBC 5.1 03/09/2016   HGB 14.2 03/09/2016   HCT 41.6 03/09/2016   MCV 90.4 03/09/2016   PLT 295 03/09/2016   Lab Results  Component Value Date   NA 137 03/09/2016   K 5.0 03/09/2016   CO2 26 03/09/2016   GLUCOSE 96 03/09/2016   BUN 13 03/09/2016   CREATININE 1.00 03/09/2016   BILITOT 0.8 03/09/2016   ALKPHOS 51 03/09/2016   AST 22 03/09/2016   ALT 14 03/09/2016   PROT 6.6 03/09/2016   ALBUMIN 4.6 03/09/2016   CALCIUM 9.4 03/09/2016   Lab Results  Component Value Date   CHOL 183 11/25/2014   Lab Results  Component Value Date   HDL 106 11/25/2014   Lab Results  Component Value Date   LDLCALC 70 11/25/2014   Lab Results  Component Value Date   TRIG 36 11/25/2014   Lab Results  Component Value Date   CHOLHDL 1.7 11/25/2014   No results found for: HGBA1C     Assessment & Plan:   1. Breast pain, left Symptoms and presentation of intermittent breast pain of the left breast at the 12 o'clock position for approximately the last 10 days. This does not appear to be cardiac, pulmonic, or musculoskeletal  In origin. Patient would feel more comfortable obtaining an ultrasound as soon as possible.   PLAN: -Utilize Ibuprofen 400-800mg  every 4-6 hours for the next 2-3 days. -Alternate heat and ice 20 minutes on and off every 4-6 hours for the next 3 days to the affected area.  -Referral to breast center for ultrasound of the left breast.   -Will plan follow-up once ultrasound results received. -Patient encouraged to contact the office with any questions, concerns, or changes.   - US BREAST LTD UNI LEFT INC AXILLA   Tollie Eth, NP

## 2019-11-23 NOTE — Patient Instructions (Signed)
Apply heat to the breast for 20 minutes or so at a time several times a day.   You may also apply ice or cool compresses, if this makes the area feel better.   You can take 400-800mg  of ibuprofen every 6-8 hours. I would suggest doing this continually for the first 2 days and then you can take it less often or as needed.   I have sent the referral for the ultrasound and they will be in touch with you for scheduling that.    Breast Tenderness Breast tenderness is a common problem for women of all ages, but may also occur in men. Breast tenderness may range from mild discomfort to severe pain. In women, the pain usually comes and goes with the menstrual cycle, but it can also be constant. Breast tenderness has many possible causes, including hormone changes, infections, and taking certain medicines. You may have tests, such as a mammogram or an ultrasound, to check for any unusual findings. Having breast tenderness usually does not mean that you have breast cancer. Follow these instructions at home: Managing pain and discomfort   If directed, put ice to the painful area. To do this: ? Put ice in a plastic bag. ? Place a towel between your skin and the bag. ? Leave the ice on for 20 minutes, 2-3 times a day.  Wear a supportive bra, especially during exercise. You may also want to wear a supportive bra while sleeping if your breasts are very tender. Medicines  Take over-the-counter and prescription medicines only as told by your health care provider. If the cause of your pain is infection, you may be prescribed an antibiotic medicine.  If you were prescribed an antibiotic, take it as told by your health care provider. Do not stop taking the antibiotic even if you start to feel better. Eating and drinking  Your health care provider may recommend that you lessen the amount of fat in your diet. You can do this by: ? Limiting fried foods. ? Cooking foods using methods such as baking, boiling,  grilling, and broiling.  Decrease the amount of caffeine in your diet. Instead, drink more water and choose caffeine-free drinks. General instructions   Keep a log of the days and times when your breasts are most tender.  Ask your health care provider how to do breast exams at home. This will help you notice if you have an unusual growth or lump.  Keep all follow-up visits as told by your health care provider. This is important. Contact a health care provider if:  Any part of your breast is hard, red, and hot to the touch. This may be a sign of infection.  You are a woman and: ? Not breastfeeding and you have fluid, especially blood or pus, coming out of your nipples. ? Have a new or painful lump in your breast that remains after your menstrual period ends.  You have a fever.  Your pain does not improve or it gets worse.  Your pain is interfering with your daily activities. Summary  Breast tenderness may range from mild discomfort to severe pain.  Breast tenderness has many possible causes, including hormone changes, infections, and taking certain medicines.  It can be treated with ice, wearing a supportive bra, and medicines.  Make changes to your diet if told to by your health care provider. This information is not intended to replace advice given to you by your health care provider. Make sure you discuss any questions  you have with your health care provider. Document Revised: 10/30/2018 Document Reviewed: 10/30/2018 Elsevier Patient Education  2020 [employer].

## 2019-11-27 ENCOUNTER — Other Ambulatory Visit: Payer: Self-pay | Admitting: Nurse Practitioner

## 2019-11-27 DIAGNOSIS — N644 Mastodynia: Secondary | ICD-10-CM

## 2019-12-26 DIAGNOSIS — M545 Low back pain: Secondary | ICD-10-CM | POA: Diagnosis not present

## 2019-12-26 DIAGNOSIS — M5126 Other intervertebral disc displacement, lumbar region: Secondary | ICD-10-CM | POA: Diagnosis not present

## 2020-01-03 DIAGNOSIS — M5126 Other intervertebral disc displacement, lumbar region: Secondary | ICD-10-CM | POA: Diagnosis not present

## 2020-01-03 DIAGNOSIS — M545 Low back pain: Secondary | ICD-10-CM | POA: Diagnosis not present

## 2020-06-19 DIAGNOSIS — L814 Other melanin hyperpigmentation: Secondary | ICD-10-CM | POA: Diagnosis not present

## 2020-06-19 DIAGNOSIS — L578 Other skin changes due to chronic exposure to nonionizing radiation: Secondary | ICD-10-CM | POA: Diagnosis not present

## 2020-06-19 DIAGNOSIS — D225 Melanocytic nevi of trunk: Secondary | ICD-10-CM | POA: Diagnosis not present

## 2020-06-19 DIAGNOSIS — D1801 Hemangioma of skin and subcutaneous tissue: Secondary | ICD-10-CM | POA: Diagnosis not present

## 2020-07-14 DIAGNOSIS — S90851A Superficial foreign body, right foot, initial encounter: Secondary | ICD-10-CM | POA: Diagnosis not present

## 2020-08-01 DIAGNOSIS — Z20822 Contact with and (suspected) exposure to covid-19: Secondary | ICD-10-CM | POA: Diagnosis not present

## 2020-08-01 DIAGNOSIS — R059 Cough, unspecified: Secondary | ICD-10-CM | POA: Diagnosis not present

## 2020-08-01 DIAGNOSIS — J029 Acute pharyngitis, unspecified: Secondary | ICD-10-CM | POA: Diagnosis not present

## 2020-08-01 DIAGNOSIS — R0981 Nasal congestion: Secondary | ICD-10-CM | POA: Diagnosis not present

## 2020-08-04 DIAGNOSIS — J019 Acute sinusitis, unspecified: Secondary | ICD-10-CM | POA: Diagnosis not present

## 2020-08-20 DIAGNOSIS — Z1231 Encounter for screening mammogram for malignant neoplasm of breast: Secondary | ICD-10-CM | POA: Diagnosis not present

## 2020-08-20 DIAGNOSIS — Z01419 Encounter for gynecological examination (general) (routine) without abnormal findings: Secondary | ICD-10-CM | POA: Diagnosis not present

## 2020-08-20 DIAGNOSIS — Z124 Encounter for screening for malignant neoplasm of cervix: Secondary | ICD-10-CM | POA: Diagnosis not present

## 2020-08-20 LAB — HM PAP SMEAR: HM Pap smear: NEGATIVE

## 2020-08-29 DIAGNOSIS — M79671 Pain in right foot: Secondary | ICD-10-CM | POA: Diagnosis not present

## 2020-08-29 DIAGNOSIS — M795 Residual foreign body in soft tissue: Secondary | ICD-10-CM | POA: Diagnosis not present

## 2020-09-05 ENCOUNTER — Other Ambulatory Visit: Payer: Self-pay | Admitting: Family Medicine

## 2020-09-05 DIAGNOSIS — F32A Depression, unspecified: Secondary | ICD-10-CM

## 2020-09-06 ENCOUNTER — Other Ambulatory Visit: Payer: Self-pay | Admitting: Family Medicine

## 2020-09-06 DIAGNOSIS — F32A Depression, unspecified: Secondary | ICD-10-CM

## 2020-09-09 ENCOUNTER — Ambulatory Visit (INDEPENDENT_AMBULATORY_CARE_PROVIDER_SITE_OTHER): Payer: BC Managed Care – PPO | Admitting: Family Medicine

## 2020-09-09 ENCOUNTER — Other Ambulatory Visit: Payer: Self-pay

## 2020-09-09 ENCOUNTER — Encounter: Payer: Self-pay | Admitting: Family Medicine

## 2020-09-09 VITALS — BP 119/57 | HR 78 | Ht 71.0 in | Wt 178.0 lb

## 2020-09-09 DIAGNOSIS — F325 Major depressive disorder, single episode, in full remission: Secondary | ICD-10-CM | POA: Diagnosis not present

## 2020-09-09 DIAGNOSIS — Z13220 Encounter for screening for lipoid disorders: Secondary | ICD-10-CM

## 2020-09-09 NOTE — Progress Notes (Signed)
Established Patient Office Visit  Subjective:  Patient ID: Melinda Reynolds, female    DOB: Nov 16, 1975  Age: 45 y.o. MRN: [id number]  CC:  Chief Complaint  Patient presents with  . Depression    HPI [name] presents for   F/U Depression -she reports that she is actually doing really well overall.  Last seen about 9 months ago.  She is currently happy with her regimen sertraline and bupropion.  She does need refills.  She said she never went for her labs last time she was here but plans to.  Reports that she is sleeping well.  Negative PHQ 9.  She did report some mild symptoms of anxiety.  She recently saw Dr. Henderson Cloud, her OB/GYN and her Pap smear was updated as well as getting an updated mammogram.  We will need to call to get those results.  Past Medical History:  Diagnosis Date  . No pertinent past medical history     Past Surgical History:  Procedure Laterality Date  . BREAST BIOPSY     mole removed from breast  . CESAREAN SECTION  09/16/2011   Procedure: CESAREAN SECTION;  Surgeon: Loney Laurence, MD;  Location: [facility];  Service: Gynecology;  Laterality: N/A;  Twins  . TONSILLECTOMY    . WISDOM TOOTH EXTRACTION      Family History  Problem Relation Age of Onset  . Heart attack Father   . Hyperlipidemia Father   . Lung cancer Father        small cell  . Diabetes Maternal Grandfather     Social History   Socioeconomic History  . Marital status: Married    Spouse name: Gregary Signs    . Number of children: 2  . Years of education: Not on file  . Highest education level: Not on file  Occupational History  . Occupation: Stay at home mom.   Tobacco Use  . Smoking status: Never Smoker  . Smokeless tobacco: Never Used  Substance and Sexual Activity  . Alcohol use: Yes    Alcohol/week: 7.0 - 10.0 standard drinks    Types: 7 - 10 Glasses of wine per week  . Drug use: No  . Sexual activity: Yes    Partners: Male    Birth control/protection: None  Other Topics  Concern  . Not on file  Social History Narrative   Some regular exercise.     Social Determinants of Health   Financial Resource Strain: Not on file  Food Insecurity: Not on file  Transportation Needs: Not on file  Physical Activity: Not on file  Stress: Not on file  Social Connections: Not on file  Intimate Partner Violence: Not on file    Outpatient Medications Prior to Visit  Medication Sig Dispense Refill  . Clindamycin-Benzoyl Per, Refr, gel Apply topically 2 (two) times daily. to affected area  1  . buPROPion (WELLBUTRIN XL) 300 MG 24 hr tablet Take 1 tablet (300 mg total) by mouth daily. 90 tablet 1  . sertraline (ZOLOFT) 50 MG tablet Take 1 tablet (50 mg total) by mouth daily. 90 tablet 1   No facility-administered medications prior to visit.    No Known Allergies  ROS Review of Systems    Objective:    Physical Exam Constitutional:      Appearance: She is well-developed.  HENT:     Head: Normocephalic and atraumatic.  Cardiovascular:     Rate and Rhythm: Normal rate and regular rhythm.     Heart  sounds: Normal heart sounds.  Pulmonary:     Effort: Pulmonary effort is normal.     Breath sounds: Normal breath sounds.  Skin:    General: Skin is warm and dry.  Neurological:     Mental Status: She is alert and oriented to person, place, and time.  Psychiatric:        Behavior: Behavior normal.     BP (!) 119/57   Pulse 78   Ht 5\' 11"  (1.803 m)   Wt 178 lb (80.7 kg)   SpO2 99%   BMI 24.83 kg/m  Wt Readings from Last 3 Encounters:  09/09/20 178 lb (80.7 kg)  11/23/19 179 lb 4.8 oz (81.3 kg)  09/04/19 178 lb (80.7 kg)     There are no preventive care reminders to display for this patient.  There are no preventive care reminders to display for this patient.  Lab Results  Component Value Date   TSH 2.493 02/07/2015   Lab Results  Component Value Date   WBC 5.1 03/09/2016   HGB 14.2 03/09/2016   HCT 41.6 03/09/2016   MCV 90.4 03/09/2016    PLT 295 03/09/2016   Lab Results  Component Value Date   NA 137 03/09/2016   K 5.0 03/09/2016   CO2 26 03/09/2016   GLUCOSE 96 03/09/2016   BUN 13 03/09/2016   CREATININE 1.00 03/09/2016   BILITOT 0.8 03/09/2016   ALKPHOS 51 03/09/2016   AST 22 03/09/2016   ALT 14 03/09/2016   PROT 6.6 03/09/2016   ALBUMIN 4.6 03/09/2016   CALCIUM 9.4 03/09/2016   Lab Results  Component Value Date   CHOL 183 11/25/2014   Lab Results  Component Value Date   HDL 106 11/25/2014   Lab Results  Component Value Date   LDLCALC 70 11/25/2014   Lab Results  Component Value Date   TRIG 36 11/25/2014   Lab Results  Component Value Date   CHOLHDL 1.7 11/25/2014   No results found for: HGBA1C    Assessment & Plan:   Problem List Items Addressed This Visit      Other   Depression, major, single episode, in partial remission (HCC) - Primary    Well controlled. Continue current regimen. Follow up in  1 year.       Other Visit Diagnoses    Screening, lipid       Relevant Orders   CBC   COMPLETE METABOLIC PANEL WITH GFR   Lipid panel      No orders of the defined types were placed in this encounter.   Follow-up: Return in about 1 year (around 09/09/2021) for Mood medication .    09/11/2021, MD

## 2020-09-09 NOTE — Assessment & Plan Note (Signed)
Well controlled. Continue current regimen. Follow up in  1 year.

## 2020-09-17 DIAGNOSIS — Z13220 Encounter for screening for lipoid disorders: Secondary | ICD-10-CM | POA: Diagnosis not present

## 2020-09-17 DIAGNOSIS — F325 Major depressive disorder, single episode, in full remission: Secondary | ICD-10-CM | POA: Diagnosis not present

## 2020-09-17 LAB — COMPLETE METABOLIC PANEL WITH GFR
AG Ratio: 2.3 (calc) (ref 1.0–2.5)
ALT: 10 U/L (ref 6–29)
AST: 14 U/L (ref 10–30)
Albumin: 4.6 g/dL (ref 3.6–5.1)
Alkaline phosphatase (APISO): 51 U/L (ref 31–125)
BUN: 17 mg/dL (ref 7–25)
CO2: 27 mmol/L (ref 20–32)
Calcium: 9.6 mg/dL (ref 8.6–10.2)
Chloride: 101 mmol/L (ref 98–110)
Creat: 1.01 mg/dL (ref 0.50–1.10)
GFR, Est African American: 79 mL/min/{1.73_m2} (ref >=60)
GFR, Est Non African American: 68 mL/min/{1.73_m2} (ref >=60)
Globulin: 2 g/dL (calc) (ref 1.9–3.7)
Glucose, Bld: 79 mg/dL (ref 65–99)
Potassium: 4.3 mmol/L (ref 3.5–5.3)
Sodium: 135 mmol/L (ref 135–146)
Total Bilirubin: 0.9 mg/dL (ref 0.2–1.2)
Total Protein: 6.6 g/dL (ref 6.1–8.1)

## 2020-09-17 LAB — CBC
HCT: 44.4 % (ref 35.0–45.0)
Hemoglobin: 14.9 g/dL (ref 11.7–15.5)
MCH: 30.7 pg (ref 27.0–33.0)
MCHC: 33.6 g/dL (ref 32.0–36.0)
MCV: 91.5 fL (ref 80.0–100.0)
MPV: 10.9 fL (ref 7.5–12.5)
Platelets: 261 10*3/uL (ref 140–400)
RBC: 4.85 10*6/uL (ref 3.80–5.10)
RDW: 11.5 % (ref 11.0–15.0)
WBC: 5.3 10*3/uL (ref 3.8–10.8)

## 2020-09-17 LAB — LIPID PANEL
Cholesterol: 254 mg/dL — ABNORMAL HIGH (ref <200)
HDL: 116 mg/dL (ref >=50)
LDL Cholesterol (Calc): 124 mg/dL (calc) — ABNORMAL HIGH
Non-HDL Cholesterol (Calc): 138 mg/dL (calc) — ABNORMAL HIGH (ref <130)
Total CHOL/HDL Ratio: 2.2 (calc) (ref <5.0)
Triglycerides: 53 mg/dL (ref <150)

## 2020-09-18 ENCOUNTER — Encounter: Payer: Self-pay | Admitting: Family Medicine

## 2020-09-18 DIAGNOSIS — Z8249 Family history of ischemic heart disease and other diseases of the circulatory system: Secondary | ICD-10-CM

## 2020-09-18 DIAGNOSIS — E785 Hyperlipidemia, unspecified: Secondary | ICD-10-CM

## 2020-09-18 NOTE — Telephone Encounter (Signed)
ETT order placed

## 2020-09-18 NOTE — Addendum Note (Signed)
Addended by: Nani Gasser D on: 09/18/2020 10:15 AM   Modules accepted: Orders

## 2020-09-24 NOTE — Addendum Note (Signed)
Addended by: Nani Gasser D on: 09/24/2020 05:49 PM   Modules accepted: Orders

## 2020-09-25 DIAGNOSIS — J069 Acute upper respiratory infection, unspecified: Secondary | ICD-10-CM | POA: Diagnosis not present

## 2020-10-01 ENCOUNTER — Other Ambulatory Visit (HOSPITAL_COMMUNITY): Payer: BC Managed Care – PPO

## 2020-10-06 ENCOUNTER — Other Ambulatory Visit: Payer: Self-pay

## 2020-10-06 ENCOUNTER — Encounter: Payer: Self-pay | Admitting: Medical-Surgical

## 2020-10-06 ENCOUNTER — Ambulatory Visit (INDEPENDENT_AMBULATORY_CARE_PROVIDER_SITE_OTHER): Payer: BC Managed Care – PPO | Admitting: Medical-Surgical

## 2020-10-06 ENCOUNTER — Ambulatory Visit (INDEPENDENT_AMBULATORY_CARE_PROVIDER_SITE_OTHER): Payer: BC Managed Care – PPO

## 2020-10-06 VITALS — BP 121/69 | HR 66 | Temp 99.1°F | Ht 71.0 in | Wt 181.1 lb

## 2020-10-06 DIAGNOSIS — M545 Low back pain, unspecified: Secondary | ICD-10-CM | POA: Diagnosis not present

## 2020-10-06 DIAGNOSIS — M5126 Other intervertebral disc displacement, lumbar region: Secondary | ICD-10-CM | POA: Diagnosis not present

## 2020-10-06 DIAGNOSIS — R053 Chronic cough: Secondary | ICD-10-CM | POA: Diagnosis not present

## 2020-10-06 DIAGNOSIS — R059 Cough, unspecified: Secondary | ICD-10-CM | POA: Diagnosis not present

## 2020-10-06 MED ORDER — IPRATROPIUM BROMIDE 0.03 % NA SOLN: 2.0000 | Freq: Two times a day (BID) | NASAL | 0 refills | Status: AC

## 2020-10-06 NOTE — Progress Notes (Signed)
Subjective:    CC:  Chronic cough  HPI: Pleasant 45 year old female presenting for evaluation of a cough that has been present for approximately 2-2.5 months.  Notes that she had sinus symptoms at the end of January going into February.  She was seen via telemedicine and prescribed prednisone and Augmentin which she completed as prescribed.  Her cough continued to be a problem and she again a telemedicine visit and was prescribed Augmentin.  She has completed this without difficulty.  Unfortunately her cough remains.  Notes that she does have a small amount of green/gray mucus in the mornings but for the rest of the day her cough is nonproductive.  Notes an allergy to dust but this is usually very manageable.  She does have a new dog and is planning to see her allergist next week.  Minimal sinus drainage with mild postnasal drip noted.  Denies fever, chills, chest pain, shortness of breath, hemoptysis, and GI symptoms.  She has tried Mucinex DM, Zyrtec, and Lawyer.  I reviewed the past medical history, family history, social history, surgical history, and allergies today and no changes were needed.  Please see the problem list section below in epic for further details.  Past Medical History: Past Medical History:  Diagnosis Date  . No pertinent past medical history    Past Surgical History: Past Surgical History:  Procedure Laterality Date  . BREAST BIOPSY     mole removed from breast  . CESAREAN SECTION  09/16/2011   Procedure: CESAREAN SECTION;  Surgeon: Loney Laurence, MD;  Location: [facility];  Service: Gynecology;  Laterality: N/A;  Twins  . TONSILLECTOMY    . WISDOM TOOTH EXTRACTION     Social History: Social History   Socioeconomic History  . Marital status: Married    Spouse name: Gregary Signs    . Number of children: 2  . Years of education: Not on file  . Highest education level: Not on file  Occupational History  . Occupation: Stay at home mom.   Tobacco Use  .  Smoking status: Never Smoker  . Smokeless tobacco: Never Used  Substance and Sexual Activity  . Alcohol use: Yes    Alcohol/week: 7.0 - 10.0 standard drinks    Types: 7 - 10 Glasses of wine per week  . Drug use: No  . Sexual activity: Yes    Partners: Male    Birth control/protection: None  Other Topics Concern  . Not on file  Social History Narrative   Some regular exercise.     Social Determinants of Health   Financial Resource Strain: Not on file  Food Insecurity: Not on file  Transportation Needs: Not on file  Physical Activity: Not on file  Stress: Not on file  Social Connections: Not on file   Family History: Family History  Problem Relation Age of Onset  . Heart attack Father   . Hyperlipidemia Father   . Lung cancer Father        small cell  . Diabetes Maternal Grandfather    Allergies: No Known Allergies Medications: See med rec.  Review of Systems: See HPI for pertinent positives and negatives.   Objective:    General: Well Developed, well nourished, and in no acute distress.  Neuro: Alert and oriented x3.  HEENT: Normocephalic, atraumatic.  Skin: Warm and dry. Cardiac: Regular rate and rhythm, no murmurs rubs or gallops, no lower extremity edema.  Respiratory: Clear to auscultation bilaterally. Not using accessory muscles, speaking in full  sentences.  Impression and Recommendations:    1. Chronic cough Getting chest x-ray today.  Consider postviral syndrome versus silent reflux.  Since she does have some postnasal drip, recommend continuing Zyrtec and add in Flonase.  Sending in Atrovent to see if it will help dry up some of the postnasal drip.  Plan to go forward with appointment for allergy testing. - DG Chest 2 View; Future  Return if symptoms worsen or fail to improve. ___________________________________________ Thayer Ohm, DNP, APRN, FNP-BC Primary Care and Sports Medicine [facility]

## 2020-10-07 ENCOUNTER — Encounter: Payer: Self-pay | Admitting: Medical-Surgical

## 2020-10-08 MED ORDER — BENZONATATE 200 MG PO CAPS: 200.0000 mg | ORAL_CAPSULE | Freq: Three times a day (TID) | ORAL | 0 refills | Status: DC | PRN

## 2020-10-10 DIAGNOSIS — M5416 Radiculopathy, lumbar region: Secondary | ICD-10-CM | POA: Diagnosis not present

## 2020-10-16 DIAGNOSIS — J300 Vasomotor rhinitis: Secondary | ICD-10-CM | POA: Diagnosis not present

## 2020-10-16 DIAGNOSIS — J329 Chronic sinusitis, unspecified: Secondary | ICD-10-CM | POA: Diagnosis not present

## 2020-10-16 DIAGNOSIS — R053 Chronic cough: Secondary | ICD-10-CM | POA: Diagnosis not present

## 2021-02-06 ENCOUNTER — Telehealth: Payer: Self-pay | Admitting: Family Medicine

## 2021-02-06 NOTE — Telephone Encounter (Signed)
Please fax request to [facility] for most recent Pap smear performed in approximately March 2022.

## 2021-02-12 DIAGNOSIS — L918 Other hypertrophic disorders of the skin: Secondary | ICD-10-CM | POA: Diagnosis not present

## 2021-02-12 DIAGNOSIS — C44519 Basal cell carcinoma of skin of other part of trunk: Secondary | ICD-10-CM | POA: Diagnosis not present

## 2021-02-19 ENCOUNTER — Other Ambulatory Visit: Payer: Self-pay | Admitting: *Deleted

## 2021-02-19 DIAGNOSIS — F32A Depression, unspecified: Secondary | ICD-10-CM

## 2021-02-19 MED ORDER — BUPROPION HCL ER (XL) 300 MG PO TB24: 300.0000 mg | ORAL_TABLET | Freq: Every day | ORAL | 1 refills | Status: DC

## 2021-02-19 MED ORDER — SERTRALINE HCL 50 MG PO TABS: 50.0000 mg | ORAL_TABLET | Freq: Every day | ORAL | 1 refills | Status: DC

## 2021-04-06 DIAGNOSIS — B9689 Other specified bacterial agents as the cause of diseases classified elsewhere: Secondary | ICD-10-CM | POA: Diagnosis not present

## 2021-04-06 DIAGNOSIS — J019 Acute sinusitis, unspecified: Secondary | ICD-10-CM | POA: Diagnosis not present

## 2021-08-23 ENCOUNTER — Other Ambulatory Visit: Payer: Self-pay | Admitting: Family Medicine

## 2021-08-23 DIAGNOSIS — F32A Depression, unspecified: Secondary | ICD-10-CM

## 2021-09-01 DIAGNOSIS — Z1231 Encounter for screening mammogram for malignant neoplasm of breast: Secondary | ICD-10-CM | POA: Diagnosis not present

## 2021-09-01 DIAGNOSIS — Z01419 Encounter for gynecological examination (general) (routine) without abnormal findings: Secondary | ICD-10-CM | POA: Diagnosis not present

## 2021-09-01 DIAGNOSIS — Z6823 Body mass index (BMI) 23.0-23.9, adult: Secondary | ICD-10-CM | POA: Diagnosis not present

## 2021-10-19 ENCOUNTER — Ambulatory Visit: Payer: BC Managed Care – PPO | Admitting: Family Medicine

## 2021-12-17 ENCOUNTER — Ambulatory Visit: Payer: BC Managed Care – PPO | Admitting: Family Medicine

## 2021-12-23 ENCOUNTER — Ambulatory Visit: Payer: BC Managed Care – PPO | Admitting: Family Medicine

## 2021-12-23 NOTE — Progress Notes (Deleted)
   Acute Office Visit  Subjective:     Patient ID: Melinda Reynolds, female    DOB: February 18, 1976, 46 y.o.   MRN: [id number]  No chief complaint on file.   HPI Patient is in today for ***  ROS      Objective:    There were no vitals taken for this visit. {Vitals History (Optional):23777}  Physical Exam  No results found for any visits on 12/23/21.      Assessment & Plan:   Problem List Items Addressed This Visit   None   No orders of the defined types were placed in this encounter.   No follow-ups on file.  Nani Gasser, MD

## 2022-02-16 ENCOUNTER — Other Ambulatory Visit: Payer: Self-pay | Admitting: Family Medicine

## 2022-02-16 DIAGNOSIS — F32A Depression, unspecified: Secondary | ICD-10-CM

## 2022-05-27 ENCOUNTER — Encounter: Payer: Self-pay | Admitting: Family Medicine

## 2022-05-27 ENCOUNTER — Telehealth (INDEPENDENT_AMBULATORY_CARE_PROVIDER_SITE_OTHER): Payer: BC Managed Care – PPO | Admitting: Family Medicine

## 2022-05-27 VITALS — Temp 98.6°F | Ht 71.0 in | Wt 181.0 lb

## 2022-05-27 DIAGNOSIS — U071 COVID-19: Secondary | ICD-10-CM | POA: Diagnosis not present

## 2022-05-27 MED ORDER — NIRMATRELVIR/RITONAVIR (PAXLOVID)TABLET: 3.0000 | ORAL_TABLET | Freq: Two times a day (BID) | ORAL | 0 refills | Status: AC

## 2022-05-27 NOTE — Progress Notes (Signed)
Melinda Reynolds - 46 y.o. female MRN [id number]  Date of birth: 01/05/1976   This visit type was conducted due to national recommendations for restrictions regarding the COVID-19 Pandemic (e.g. social distancing).  This format is felt to be most appropriate for this patient at this time.  All issues noted in this document were discussed and addressed.  No physical exam was performed (except for noted visual exam findings with Video Visits).  I discussed the limitations of evaluation and management by telemedicine and the availability of in person appointments. The patient expressed understanding and agreed to proceed.  I connected withNAME@ on 05/27/22 at 11:30 AM EST by a video enabled telemedicine application and verified that I am speaking with the correct person using two identifiers.  Present at visit: Everrett Coombe, DO Karyl Kinnier   Patient Location: Home [address] [place] [address]   Provider location:   [facility]  Chief Complaint  Patient presents with   Covid Positive    HPI  Melinda Reynolds is a 46 y.o. female who presents via audio/video conferencing for a telehealth visit today.  Reports having symptoms of COVID started about 4 days ago.  Symptoms include congestion, fatigue, body aches, sore throat and headache.  She did test positive for COVID on Monday.  She has tried Tylenol and ibuprofen with some relief.  She denies any dyspnea or difficulty breathing.  She is interested in Paxlovid.   ROS:  A comprehensive ROS was completed and negative except as noted per HPI  Past Medical History:  Diagnosis Date   No pertinent past medical history     Past Surgical History:  Procedure Laterality Date   BREAST BIOPSY     mole removed from breast   CESAREAN SECTION  09/16/2011   Procedure: CESAREAN SECTION;  Surgeon: Loney Laurence, MD;  Location: [facility];  Service: Gynecology;  Laterality: N/A;  Twins   TONSILLECTOMY     WISDOM TOOTH EXTRACTION      Family  History  Problem Relation Age of Onset   Heart attack Father    Hyperlipidemia Father    Lung cancer Father        small cell   Diabetes Maternal Grandfather     Social History   Socioeconomic History   Marital status: Married    Spouse name: Gregary Signs     Number of children: 2   Years of education: Not on file   Highest education level: Not on file  Occupational History   Occupation: Stay at home mom.   Tobacco Use   Smoking status: Never   Smokeless tobacco: Never  Substance and Sexual Activity   Alcohol use: Yes    Alcohol/week: 7.0 - 10.0 standard drinks of alcohol    Types: 7 - 10 Glasses of wine per week   Drug use: No   Sexual activity: Yes    Partners: Male    Birth control/protection: None  Other Topics Concern   Not on file  Social History Narrative   Some regular exercise.     Social Determinants of Health   Financial Resource Strain: Not on file  Food Insecurity: Not on file  Transportation Needs: Not on file  Physical Activity: Not on file  Stress: Not on file  Social Connections: Not on file  Intimate Partner Violence: Not on file     Current Outpatient Medications:    buPROPion (WELLBUTRIN XL) 300 MG 24 hr tablet, Take 1 tablet (300 mg total) by mouth  daily., Disp: 90 tablet, Rfl: 1   Clindamycin-Benzoyl Per, Refr, gel, Apply topically 2 (two) times daily. to affected area, Disp: , Rfl: 1   ipratropium (ATROVENT) 0.03 % nasal spray, Place 2 sprays into both nostrils every 12 (twelve) hours., Disp: 30 mL, Rfl: 0   nirmatrelvir/ritonavir EUA (PAXLOVID) 20 x 150 MG & 10 x 100MG  TABS, Take 3 tablets by mouth 2 (two) times daily for 5 days. (Take nirmatrelvir 150 mg two tablets twice daily for 5 days and ritonavir 100 mg one tablet twice daily for 5 days) Patient GFR is 68, Disp: 30 tablet, Rfl: 0   sertraline (ZOLOFT) 50 MG tablet, Take 1 tablet (50 mg total) by mouth daily., Disp: 90 tablet, Rfl: 1  EXAM:  VITALS per patient if applicable: Temp 98.6 F  (37 C)   Ht 5\' 11"  (1.803 m)   Wt 181 lb (82.1 kg)   BMI 25.24 kg/m   GENERAL: alert, oriented, appears well and in no acute distress  HEENT: atraumatic, conjunttiva clear, no obvious abnormalities on inspection of external nose and ears  NECK: normal movements of the head and neck  LUNGS: on inspection no signs of respiratory distress, breathing rate appears normal, no obvious gross SOB, gasping or wheezing  CV: no obvious cyanosis  MS: moves all visible extremities without noticeable abnormality  PSYCH/NEURO: pleasant and cooperative, no obvious depression or anxiety, speech and thought processing grossly intact  ASSESSMENT AND PLAN:  Discussed the following assessment and plan:  COVID-19 Recommend continuation with symptomatic and supportive care with relative rest, increase fluids, nasal saline rinses and over-the-counter analgesics/antipyretics.  She would like to have Paxlovid added also under 5-day course of this.  We did discuss potential for rebound symptoms after completion.  Instructed to contact clinic if having significantly worsening symptoms.     I discussed the assessment and treatment plan with the patient. The patient was provided an opportunity to ask questions and all were answered. The patient agreed with the plan and demonstrated an understanding of the instructions.   The patient was advised to call back or seek an in-person evaluation if the symptoms worsen or if the condition fails to improve as anticipated.    , DO

## 2022-05-27 NOTE — Assessment & Plan Note (Signed)
Recommend continuation with symptomatic and supportive care with relative rest, increase fluids, nasal saline rinses and over-the-counter analgesics/antipyretics.  She would like to have Paxlovid added also under 5-day course of this.  We did discuss potential for rebound symptoms after completion.  Instructed to contact clinic if having significantly worsening symptoms.

## 2022-05-27 NOTE — Progress Notes (Signed)
Symptoms started Sunday. Tested on Monday. 104 fever. Fever now resolved.   Meds: Tylenol and Advil

## 2022-09-22 DIAGNOSIS — Z6826 Body mass index (BMI) 26.0-26.9, adult: Secondary | ICD-10-CM | POA: Diagnosis not present

## 2022-09-22 DIAGNOSIS — Z01419 Encounter for gynecological examination (general) (routine) without abnormal findings: Secondary | ICD-10-CM | POA: Diagnosis not present

## 2022-09-22 DIAGNOSIS — Z1231 Encounter for screening mammogram for malignant neoplasm of breast: Secondary | ICD-10-CM | POA: Diagnosis not present

## 2022-09-22 LAB — HM MAMMOGRAPHY

## 2022-09-23 ENCOUNTER — Telehealth: Payer: Self-pay | Admitting: *Deleted

## 2022-09-23 NOTE — Telephone Encounter (Signed)
Records request sent for mammogram report

## 2022-10-04 DIAGNOSIS — M5416 Radiculopathy, lumbar region: Secondary | ICD-10-CM | POA: Diagnosis not present

## 2022-10-07 ENCOUNTER — Encounter: Payer: Self-pay | Admitting: Family Medicine

## 2022-10-12 DIAGNOSIS — M545 Low back pain, unspecified: Secondary | ICD-10-CM | POA: Diagnosis not present

## 2022-10-14 DIAGNOSIS — M545 Low back pain, unspecified: Secondary | ICD-10-CM | POA: Diagnosis not present

## 2022-10-21 DIAGNOSIS — M5416 Radiculopathy, lumbar region: Secondary | ICD-10-CM | POA: Diagnosis not present

## 2022-12-20 ENCOUNTER — Other Ambulatory Visit: Payer: Self-pay | Admitting: Family Medicine

## 2022-12-20 DIAGNOSIS — F32A Depression, unspecified: Secondary | ICD-10-CM

## 2023-04-28 DIAGNOSIS — L578 Other skin changes due to chronic exposure to nonionizing radiation: Secondary | ICD-10-CM | POA: Diagnosis not present

## 2023-04-28 DIAGNOSIS — L814 Other melanin hyperpigmentation: Secondary | ICD-10-CM | POA: Diagnosis not present

## 2023-04-28 DIAGNOSIS — D485 Neoplasm of uncertain behavior of skin: Secondary | ICD-10-CM | POA: Diagnosis not present

## 2023-04-28 DIAGNOSIS — I781 Nevus, non-neoplastic: Secondary | ICD-10-CM | POA: Diagnosis not present

## 2023-04-28 DIAGNOSIS — L570 Actinic keratosis: Secondary | ICD-10-CM | POA: Diagnosis not present

## 2023-05-08 DIAGNOSIS — B9689 Other specified bacterial agents as the cause of diseases classified elsewhere: Secondary | ICD-10-CM | POA: Diagnosis not present

## 2023-05-08 DIAGNOSIS — J019 Acute sinusitis, unspecified: Secondary | ICD-10-CM | POA: Diagnosis not present

## 2023-07-04 DIAGNOSIS — L570 Actinic keratosis: Secondary | ICD-10-CM | POA: Diagnosis not present

## 2023-12-20 DIAGNOSIS — N390 Urinary tract infection, site not specified: Secondary | ICD-10-CM | POA: Diagnosis not present

## 2024-02-16 DIAGNOSIS — Z1231 Encounter for screening mammogram for malignant neoplasm of breast: Secondary | ICD-10-CM | POA: Diagnosis not present

## 2024-02-16 DIAGNOSIS — Z01419 Encounter for gynecological examination (general) (routine) without abnormal findings: Secondary | ICD-10-CM | POA: Diagnosis not present

## 2024-04-18 DIAGNOSIS — M5416 Radiculopathy, lumbar region: Secondary | ICD-10-CM | POA: Diagnosis not present

## 2024-04-27 DIAGNOSIS — M5416 Radiculopathy, lumbar region: Secondary | ICD-10-CM | POA: Diagnosis not present
